# Patient Record
Sex: Male | Born: 1945
Health system: Southern US, Community
[De-identification: ages and names within clinical notes are randomized; demographics above are authoritative.]

## PROBLEM LIST (undated history)

## (undated) DIAGNOSIS — I2510 Atherosclerotic heart disease of native coronary artery without angina pectoris: Secondary | ICD-10-CM

## (undated) HISTORY — PX: CORONARY ARTERY BYPASS GRAFT: SHX141

## (undated) HISTORY — PX: TONSILLECTOMY: SUR1361

---

## 2014-01-26 ENCOUNTER — Encounter (HOSPITAL_COMMUNITY)
Admission: RE | Admit: 2014-01-26 | Discharge: 2014-01-26 | Disposition: A | Discharge status: Home / Self Care | Payer: Self-pay | Source: Ambulatory Visit | Attending: Cardiology | Admitting: Cardiology

## 2014-01-26 DIAGNOSIS — Z951 Presence of aortocoronary bypass graft: Secondary | ICD-10-CM | POA: Insufficient documentation

## 2014-01-26 DIAGNOSIS — I2510 Atherosclerotic heart disease of native coronary artery without angina pectoris: Secondary | ICD-10-CM | POA: Insufficient documentation

## 2014-01-28 ENCOUNTER — Encounter (HOSPITAL_COMMUNITY): Payer: Self-pay

## 2014-01-28 ENCOUNTER — Encounter (HOSPITAL_COMMUNITY)
Admission: RE | Admit: 2014-01-28 | Discharge: 2014-01-28 | Disposition: A | Discharge status: Home / Self Care | Payer: Self-pay | Source: Ambulatory Visit | Attending: Cardiology | Admitting: Cardiology

## 2014-01-30 ENCOUNTER — Encounter (HOSPITAL_COMMUNITY): Payer: Self-pay

## 2014-01-30 ENCOUNTER — Encounter (HOSPITAL_COMMUNITY)
Admission: RE | Admit: 2014-01-30 | Discharge: 2014-01-30 | Disposition: A | Discharge status: Home / Self Care | Payer: Self-pay | Source: Ambulatory Visit | Attending: Cardiology | Admitting: Cardiology

## 2014-02-02 ENCOUNTER — Encounter (HOSPITAL_COMMUNITY): Payer: Self-pay

## 2014-02-02 ENCOUNTER — Encounter (HOSPITAL_COMMUNITY)
Admission: RE | Admit: 2014-02-02 | Discharge: 2014-02-02 | Disposition: A | Discharge status: Home / Self Care | Payer: Self-pay | Source: Ambulatory Visit | Attending: Cardiology | Admitting: Cardiology

## 2014-02-04 ENCOUNTER — Encounter (HOSPITAL_COMMUNITY): Payer: Self-pay

## 2014-02-04 ENCOUNTER — Encounter (HOSPITAL_COMMUNITY)
Admission: RE | Admit: 2014-02-04 | Discharge: 2014-02-04 | Disposition: A | Discharge status: Home / Self Care | Payer: Self-pay | Source: Ambulatory Visit | Attending: Cardiology | Admitting: Cardiology

## 2014-02-06 ENCOUNTER — Encounter (HOSPITAL_COMMUNITY): Payer: Self-pay

## 2014-02-09 ENCOUNTER — Encounter (HOSPITAL_COMMUNITY): Payer: Self-pay

## 2014-02-11 ENCOUNTER — Encounter (HOSPITAL_COMMUNITY): Payer: Self-pay

## 2014-02-13 ENCOUNTER — Encounter (HOSPITAL_COMMUNITY): Payer: Self-pay

## 2014-02-16 ENCOUNTER — Encounter (HOSPITAL_COMMUNITY): Payer: Self-pay

## 2014-02-16 ENCOUNTER — Encounter (HOSPITAL_COMMUNITY)
Admission: RE | Admit: 2014-02-16 | Discharge: 2014-02-16 | Disposition: A | Discharge status: Home / Self Care | Payer: Self-pay | Source: Ambulatory Visit | Attending: Cardiology | Admitting: Cardiology

## 2014-02-16 DIAGNOSIS — Z951 Presence of aortocoronary bypass graft: Secondary | ICD-10-CM | POA: Insufficient documentation

## 2014-02-16 DIAGNOSIS — I2510 Atherosclerotic heart disease of native coronary artery without angina pectoris: Secondary | ICD-10-CM | POA: Insufficient documentation

## 2014-02-18 ENCOUNTER — Encounter (HOSPITAL_COMMUNITY): Payer: Self-pay

## 2014-02-18 ENCOUNTER — Encounter (HOSPITAL_COMMUNITY)
Admission: RE | Admit: 2014-02-18 | Discharge: 2014-02-18 | Disposition: A | Discharge status: Home / Self Care | Payer: Self-pay | Source: Ambulatory Visit | Attending: Cardiology | Admitting: Cardiology

## 2014-02-20 ENCOUNTER — Encounter (HOSPITAL_COMMUNITY)
Admission: RE | Admit: 2014-02-20 | Discharge: 2014-02-20 | Disposition: A | Discharge status: Home / Self Care | Payer: Self-pay | Source: Ambulatory Visit | Attending: Cardiology | Admitting: Cardiology

## 2014-02-20 ENCOUNTER — Encounter (HOSPITAL_COMMUNITY): Payer: Self-pay

## 2014-02-23 ENCOUNTER — Encounter (HOSPITAL_COMMUNITY)
Admission: RE | Admit: 2014-02-23 | Discharge: 2014-02-23 | Disposition: A | Discharge status: Home / Self Care | Payer: Self-pay | Source: Ambulatory Visit | Attending: Cardiology | Admitting: Cardiology

## 2014-02-23 ENCOUNTER — Encounter (HOSPITAL_COMMUNITY): Payer: Self-pay

## 2014-02-25 ENCOUNTER — Encounter (HOSPITAL_COMMUNITY): Payer: Self-pay

## 2014-02-25 ENCOUNTER — Encounter (HOSPITAL_COMMUNITY)
Admission: RE | Admit: 2014-02-25 | Discharge: 2014-02-25 | Disposition: A | Discharge status: Home / Self Care | Payer: Self-pay | Source: Ambulatory Visit | Attending: Cardiology | Admitting: Cardiology

## 2014-02-27 ENCOUNTER — Encounter (HOSPITAL_COMMUNITY): Payer: Self-pay

## 2014-02-27 ENCOUNTER — Encounter (HOSPITAL_COMMUNITY)
Admission: RE | Admit: 2014-02-27 | Discharge: 2014-02-27 | Disposition: A | Discharge status: Home / Self Care | Payer: Self-pay | Source: Ambulatory Visit | Attending: Cardiology | Admitting: Cardiology

## 2014-03-02 ENCOUNTER — Encounter (HOSPITAL_COMMUNITY): Payer: Self-pay

## 2014-03-02 ENCOUNTER — Encounter (HOSPITAL_COMMUNITY)
Admission: RE | Admit: 2014-03-02 | Discharge: 2014-03-02 | Disposition: A | Discharge status: Home / Self Care | Payer: Self-pay | Source: Ambulatory Visit | Attending: Cardiology | Admitting: Cardiology

## 2014-03-04 ENCOUNTER — Encounter (HOSPITAL_COMMUNITY)
Admission: RE | Admit: 2014-03-04 | Discharge: 2014-03-04 | Disposition: A | Discharge status: Home / Self Care | Payer: Self-pay | Source: Ambulatory Visit | Attending: Cardiology | Admitting: Cardiology

## 2014-03-04 ENCOUNTER — Encounter (HOSPITAL_COMMUNITY): Payer: Self-pay

## 2014-03-06 ENCOUNTER — Encounter (HOSPITAL_COMMUNITY): Payer: Self-pay

## 2014-03-09 ENCOUNTER — Encounter (HOSPITAL_COMMUNITY): Payer: Self-pay

## 2014-03-11 ENCOUNTER — Encounter (HOSPITAL_COMMUNITY)
Admission: RE | Admit: 2014-03-11 | Discharge: 2014-03-11 | Disposition: A | Discharge status: Home / Self Care | Payer: Self-pay | Source: Ambulatory Visit | Attending: Cardiology | Admitting: Cardiology

## 2014-03-11 ENCOUNTER — Encounter (HOSPITAL_COMMUNITY): Payer: Self-pay

## 2014-03-13 ENCOUNTER — Encounter (HOSPITAL_COMMUNITY)
Admission: RE | Admit: 2014-03-13 | Discharge: 2014-03-13 | Disposition: A | Discharge status: Home / Self Care | Payer: Self-pay | Source: Ambulatory Visit | Attending: Cardiology | Admitting: Cardiology

## 2014-03-13 ENCOUNTER — Encounter (HOSPITAL_COMMUNITY): Payer: Self-pay

## 2014-03-16 ENCOUNTER — Encounter (HOSPITAL_COMMUNITY): Payer: Self-pay

## 2014-03-16 ENCOUNTER — Encounter (HOSPITAL_COMMUNITY)
Admission: RE | Admit: 2014-03-16 | Discharge: 2014-03-16 | Disposition: A | Discharge status: Home / Self Care | Payer: Self-pay | Source: Ambulatory Visit | Attending: Cardiology | Admitting: Cardiology

## 2014-03-16 DIAGNOSIS — I2510 Atherosclerotic heart disease of native coronary artery without angina pectoris: Secondary | ICD-10-CM | POA: Insufficient documentation

## 2014-03-16 DIAGNOSIS — Z951 Presence of aortocoronary bypass graft: Secondary | ICD-10-CM | POA: Insufficient documentation

## 2014-03-18 ENCOUNTER — Encounter (HOSPITAL_COMMUNITY): Payer: Self-pay

## 2014-03-18 ENCOUNTER — Encounter (HOSPITAL_COMMUNITY)
Admission: RE | Admit: 2014-03-18 | Discharge: 2014-03-18 | Disposition: A | Discharge status: Home / Self Care | Payer: Self-pay | Source: Ambulatory Visit | Attending: Cardiology | Admitting: Cardiology

## 2014-03-20 ENCOUNTER — Encounter (HOSPITAL_COMMUNITY)
Admission: RE | Admit: 2014-03-20 | Discharge: 2014-03-20 | Disposition: A | Discharge status: Home / Self Care | Payer: Self-pay | Source: Ambulatory Visit | Attending: Cardiology | Admitting: Cardiology

## 2014-03-20 ENCOUNTER — Encounter (HOSPITAL_COMMUNITY): Payer: Self-pay

## 2014-03-23 ENCOUNTER — Encounter (HOSPITAL_COMMUNITY)
Admission: RE | Admit: 2014-03-23 | Discharge: 2014-03-23 | Disposition: A | Discharge status: Home / Self Care | Payer: Self-pay | Source: Ambulatory Visit | Attending: Cardiology | Admitting: Cardiology

## 2014-03-23 ENCOUNTER — Encounter (HOSPITAL_COMMUNITY): Payer: Self-pay

## 2014-03-25 ENCOUNTER — Encounter (HOSPITAL_COMMUNITY): Payer: Self-pay

## 2014-03-25 ENCOUNTER — Encounter (HOSPITAL_COMMUNITY)
Admission: RE | Admit: 2014-03-25 | Discharge: 2014-03-25 | Disposition: A | Discharge status: Home / Self Care | Payer: Self-pay | Source: Ambulatory Visit | Attending: Cardiology | Admitting: Cardiology

## 2014-03-27 ENCOUNTER — Encounter (HOSPITAL_COMMUNITY)
Admission: RE | Admit: 2014-03-27 | Discharge: 2014-03-27 | Disposition: A | Discharge status: Home / Self Care | Payer: Self-pay | Source: Ambulatory Visit | Attending: Cardiology | Admitting: Cardiology

## 2014-03-27 ENCOUNTER — Encounter (HOSPITAL_COMMUNITY): Payer: Self-pay

## 2014-03-30 ENCOUNTER — Encounter (HOSPITAL_COMMUNITY): Payer: Self-pay

## 2014-04-01 ENCOUNTER — Encounter (HOSPITAL_COMMUNITY): Payer: Self-pay

## 2014-04-01 ENCOUNTER — Encounter (HOSPITAL_COMMUNITY)
Admission: RE | Admit: 2014-04-01 | Discharge: 2014-04-01 | Disposition: A | Discharge status: Home / Self Care | Payer: Self-pay | Source: Ambulatory Visit | Attending: Cardiology | Admitting: Cardiology

## 2014-04-03 ENCOUNTER — Encounter (HOSPITAL_COMMUNITY)
Admission: RE | Admit: 2014-04-03 | Discharge: 2014-04-03 | Disposition: A | Discharge status: Home / Self Care | Payer: Self-pay | Source: Ambulatory Visit | Attending: Cardiology | Admitting: Cardiology

## 2014-04-03 ENCOUNTER — Encounter (HOSPITAL_COMMUNITY): Payer: Self-pay

## 2014-04-06 ENCOUNTER — Encounter (HOSPITAL_COMMUNITY): Payer: Self-pay

## 2014-04-06 ENCOUNTER — Encounter (HOSPITAL_COMMUNITY)
Admission: RE | Admit: 2014-04-06 | Discharge: 2014-04-06 | Disposition: A | Discharge status: Home / Self Care | Payer: Self-pay | Source: Ambulatory Visit | Attending: Cardiology | Admitting: Cardiology

## 2014-04-08 ENCOUNTER — Encounter (HOSPITAL_COMMUNITY)
Admission: RE | Admit: 2014-04-08 | Discharge: 2014-04-08 | Disposition: A | Discharge status: Home / Self Care | Payer: Self-pay | Source: Ambulatory Visit | Attending: Cardiology | Admitting: Cardiology

## 2014-04-08 ENCOUNTER — Encounter (HOSPITAL_COMMUNITY): Payer: Self-pay

## 2014-04-10 ENCOUNTER — Encounter (HOSPITAL_COMMUNITY): Payer: Self-pay

## 2014-04-10 ENCOUNTER — Encounter (HOSPITAL_COMMUNITY)
Admission: RE | Admit: 2014-04-10 | Discharge: 2014-04-10 | Disposition: A | Discharge status: Home / Self Care | Payer: Self-pay | Source: Ambulatory Visit | Attending: Cardiology | Admitting: Cardiology

## 2014-04-13 ENCOUNTER — Encounter (HOSPITAL_COMMUNITY): Payer: Self-pay

## 2014-04-13 ENCOUNTER — Encounter (HOSPITAL_COMMUNITY)
Admission: RE | Admit: 2014-04-13 | Discharge: 2014-04-13 | Disposition: A | Discharge status: Home / Self Care | Payer: Self-pay | Source: Ambulatory Visit | Attending: Cardiology | Admitting: Cardiology

## 2014-04-15 ENCOUNTER — Encounter (HOSPITAL_COMMUNITY): Payer: Self-pay

## 2014-04-17 ENCOUNTER — Encounter (HOSPITAL_COMMUNITY)
Admission: RE | Admit: 2014-04-17 | Discharge: 2014-04-17 | Disposition: A | Discharge status: Home / Self Care | Payer: Self-pay | Source: Ambulatory Visit | Attending: Cardiology | Admitting: Cardiology

## 2014-04-17 ENCOUNTER — Encounter (HOSPITAL_COMMUNITY): Payer: Self-pay

## 2014-04-17 DIAGNOSIS — Z951 Presence of aortocoronary bypass graft: Secondary | ICD-10-CM | POA: Insufficient documentation

## 2014-04-17 DIAGNOSIS — I2510 Atherosclerotic heart disease of native coronary artery without angina pectoris: Secondary | ICD-10-CM | POA: Insufficient documentation

## 2014-04-20 ENCOUNTER — Encounter (HOSPITAL_COMMUNITY)
Admission: RE | Admit: 2014-04-20 | Discharge: 2014-04-20 | Disposition: A | Discharge status: Home / Self Care | Payer: Self-pay | Source: Ambulatory Visit | Attending: Cardiology | Admitting: Cardiology

## 2014-04-22 ENCOUNTER — Encounter (HOSPITAL_COMMUNITY)
Admission: RE | Admit: 2014-04-22 | Discharge: 2014-04-22 | Disposition: A | Discharge status: Home / Self Care | Payer: Self-pay | Source: Ambulatory Visit | Attending: Cardiology | Admitting: Cardiology

## 2014-04-24 ENCOUNTER — Encounter (HOSPITAL_COMMUNITY)
Admission: RE | Admit: 2014-04-24 | Discharge: 2014-04-24 | Disposition: A | Discharge status: Home / Self Care | Payer: Self-pay | Source: Ambulatory Visit | Attending: Cardiology | Admitting: Cardiology

## 2014-04-27 ENCOUNTER — Encounter (HOSPITAL_COMMUNITY)
Admission: RE | Admit: 2014-04-27 | Discharge: 2014-04-27 | Disposition: A | Discharge status: Home / Self Care | Payer: Self-pay | Source: Ambulatory Visit | Attending: Cardiology | Admitting: Cardiology

## 2014-04-29 ENCOUNTER — Encounter (HOSPITAL_COMMUNITY)
Admission: RE | Admit: 2014-04-29 | Discharge: 2014-04-29 | Disposition: A | Discharge status: Home / Self Care | Payer: Self-pay | Source: Ambulatory Visit | Attending: Cardiology | Admitting: Cardiology

## 2014-05-01 ENCOUNTER — Encounter (HOSPITAL_COMMUNITY)
Admission: RE | Admit: 2014-05-01 | Discharge: 2014-05-01 | Disposition: A | Discharge status: Home / Self Care | Payer: Self-pay | Source: Ambulatory Visit | Attending: Cardiology | Admitting: Cardiology

## 2014-05-04 ENCOUNTER — Encounter (HOSPITAL_COMMUNITY)
Admission: RE | Admit: 2014-05-04 | Discharge: 2014-05-04 | Disposition: A | Discharge status: Home / Self Care | Payer: Self-pay | Source: Ambulatory Visit | Attending: Cardiology | Admitting: Cardiology

## 2014-05-06 ENCOUNTER — Encounter (HOSPITAL_COMMUNITY): Admission: RE | Admit: 2014-05-06 | Payer: Self-pay | Source: Ambulatory Visit

## 2014-05-08 ENCOUNTER — Encounter (HOSPITAL_COMMUNITY): Payer: Self-pay

## 2014-05-11 ENCOUNTER — Encounter (HOSPITAL_COMMUNITY): Payer: Self-pay

## 2014-05-12 DIAGNOSIS — E785 Hyperlipidemia, unspecified: Secondary | ICD-10-CM | POA: Diagnosis not present

## 2014-05-12 DIAGNOSIS — I2510 Atherosclerotic heart disease of native coronary artery without angina pectoris: Secondary | ICD-10-CM | POA: Diagnosis not present

## 2014-05-13 ENCOUNTER — Encounter (HOSPITAL_COMMUNITY): Payer: Self-pay

## 2014-05-15 ENCOUNTER — Encounter (HOSPITAL_COMMUNITY): Payer: Self-pay

## 2014-05-18 ENCOUNTER — Encounter (HOSPITAL_COMMUNITY): Payer: Self-pay

## 2014-05-20 ENCOUNTER — Encounter (HOSPITAL_COMMUNITY): Payer: Self-pay

## 2014-05-22 ENCOUNTER — Encounter (HOSPITAL_COMMUNITY): Payer: Self-pay

## 2014-05-25 ENCOUNTER — Encounter (HOSPITAL_COMMUNITY): Payer: Self-pay

## 2014-06-29 DIAGNOSIS — Z1211 Encounter for screening for malignant neoplasm of colon: Secondary | ICD-10-CM | POA: Diagnosis not present

## 2014-07-03 DIAGNOSIS — D125 Benign neoplasm of sigmoid colon: Secondary | ICD-10-CM | POA: Diagnosis not present

## 2014-07-03 DIAGNOSIS — Z8249 Family history of ischemic heart disease and other diseases of the circulatory system: Secondary | ICD-10-CM | POA: Diagnosis not present

## 2014-07-03 DIAGNOSIS — D124 Benign neoplasm of descending colon: Secondary | ICD-10-CM | POA: Diagnosis not present

## 2014-07-03 DIAGNOSIS — K635 Polyp of colon: Secondary | ICD-10-CM | POA: Diagnosis not present

## 2014-07-03 DIAGNOSIS — D120 Benign neoplasm of cecum: Secondary | ICD-10-CM | POA: Diagnosis not present

## 2014-07-03 DIAGNOSIS — Z951 Presence of aortocoronary bypass graft: Secondary | ICD-10-CM | POA: Diagnosis not present

## 2014-07-03 DIAGNOSIS — Z1211 Encounter for screening for malignant neoplasm of colon: Secondary | ICD-10-CM | POA: Diagnosis not present

## 2014-07-03 DIAGNOSIS — E785 Hyperlipidemia, unspecified: Secondary | ICD-10-CM | POA: Diagnosis not present

## 2014-07-03 DIAGNOSIS — Z79899 Other long term (current) drug therapy: Secondary | ICD-10-CM | POA: Diagnosis not present

## 2014-07-03 DIAGNOSIS — D122 Benign neoplasm of ascending colon: Secondary | ICD-10-CM | POA: Diagnosis not present

## 2014-07-03 DIAGNOSIS — Z801 Family history of malignant neoplasm of trachea, bronchus and lung: Secondary | ICD-10-CM | POA: Diagnosis not present

## 2014-07-03 DIAGNOSIS — K5730 Diverticulosis of large intestine without perforation or abscess without bleeding: Secondary | ICD-10-CM | POA: Diagnosis not present

## 2014-07-03 DIAGNOSIS — I10 Essential (primary) hypertension: Secondary | ICD-10-CM | POA: Diagnosis not present

## 2014-07-03 DIAGNOSIS — I2510 Atherosclerotic heart disease of native coronary artery without angina pectoris: Secondary | ICD-10-CM | POA: Diagnosis not present

## 2014-09-03 DIAGNOSIS — Z0001 Encounter for general adult medical examination with abnormal findings: Secondary | ICD-10-CM | POA: Diagnosis not present

## 2014-09-03 DIAGNOSIS — E780 Pure hypercholesterolemia: Secondary | ICD-10-CM | POA: Diagnosis not present

## 2014-09-03 DIAGNOSIS — R7301 Impaired fasting glucose: Secondary | ICD-10-CM | POA: Diagnosis not present

## 2014-09-03 DIAGNOSIS — I2510 Atherosclerotic heart disease of native coronary artery without angina pectoris: Secondary | ICD-10-CM | POA: Diagnosis not present

## 2014-09-03 DIAGNOSIS — Z1389 Encounter for screening for other disorder: Secondary | ICD-10-CM | POA: Diagnosis not present

## 2014-09-07 DIAGNOSIS — E782 Mixed hyperlipidemia: Secondary | ICD-10-CM | POA: Diagnosis not present

## 2014-10-08 DIAGNOSIS — E782 Mixed hyperlipidemia: Secondary | ICD-10-CM | POA: Diagnosis not present

## 2014-11-08 DIAGNOSIS — I2510 Atherosclerotic heart disease of native coronary artery without angina pectoris: Secondary | ICD-10-CM | POA: Diagnosis not present

## 2014-11-17 DIAGNOSIS — E785 Hyperlipidemia, unspecified: Secondary | ICD-10-CM | POA: Diagnosis not present

## 2014-11-17 DIAGNOSIS — I2510 Atherosclerotic heart disease of native coronary artery without angina pectoris: Secondary | ICD-10-CM | POA: Diagnosis not present

## 2014-12-08 DIAGNOSIS — I2510 Atherosclerotic heart disease of native coronary artery without angina pectoris: Secondary | ICD-10-CM | POA: Diagnosis not present

## 2015-01-08 DIAGNOSIS — I2510 Atherosclerotic heart disease of native coronary artery without angina pectoris: Secondary | ICD-10-CM | POA: Diagnosis not present

## 2015-02-07 DIAGNOSIS — I2510 Atherosclerotic heart disease of native coronary artery without angina pectoris: Secondary | ICD-10-CM | POA: Diagnosis not present

## 2015-02-18 DIAGNOSIS — E782 Mixed hyperlipidemia: Secondary | ICD-10-CM | POA: Diagnosis not present

## 2015-02-18 DIAGNOSIS — R7301 Impaired fasting glucose: Secondary | ICD-10-CM | POA: Diagnosis not present

## 2015-02-18 DIAGNOSIS — I2510 Atherosclerotic heart disease of native coronary artery without angina pectoris: Secondary | ICD-10-CM | POA: Diagnosis not present

## 2015-02-25 DIAGNOSIS — E782 Mixed hyperlipidemia: Secondary | ICD-10-CM | POA: Diagnosis not present

## 2015-02-25 DIAGNOSIS — Z23 Encounter for immunization: Secondary | ICD-10-CM | POA: Diagnosis not present

## 2015-02-25 DIAGNOSIS — I2510 Atherosclerotic heart disease of native coronary artery without angina pectoris: Secondary | ICD-10-CM | POA: Diagnosis not present

## 2015-02-25 DIAGNOSIS — R7301 Impaired fasting glucose: Secondary | ICD-10-CM | POA: Diagnosis not present

## 2015-05-18 DIAGNOSIS — I2510 Atherosclerotic heart disease of native coronary artery without angina pectoris: Secondary | ICD-10-CM | POA: Diagnosis not present

## 2015-05-24 DIAGNOSIS — I2510 Atherosclerotic heart disease of native coronary artery without angina pectoris: Secondary | ICD-10-CM | POA: Diagnosis not present

## 2015-09-02 DIAGNOSIS — E782 Mixed hyperlipidemia: Secondary | ICD-10-CM | POA: Diagnosis not present

## 2015-09-02 DIAGNOSIS — R7301 Impaired fasting glucose: Secondary | ICD-10-CM | POA: Diagnosis not present

## 2015-09-06 DIAGNOSIS — Z6828 Body mass index (BMI) 28.0-28.9, adult: Secondary | ICD-10-CM | POA: Diagnosis not present

## 2015-09-06 DIAGNOSIS — Z1389 Encounter for screening for other disorder: Secondary | ICD-10-CM | POA: Diagnosis not present

## 2015-09-06 DIAGNOSIS — Z0001 Encounter for general adult medical examination with abnormal findings: Secondary | ICD-10-CM | POA: Diagnosis not present

## 2015-10-02 DIAGNOSIS — H9201 Otalgia, right ear: Secondary | ICD-10-CM | POA: Diagnosis not present

## 2015-10-08 DIAGNOSIS — H6123 Impacted cerumen, bilateral: Secondary | ICD-10-CM | POA: Diagnosis not present

## 2016-03-07 DIAGNOSIS — R739 Hyperglycemia, unspecified: Secondary | ICD-10-CM | POA: Diagnosis not present

## 2016-03-07 DIAGNOSIS — E782 Mixed hyperlipidemia: Secondary | ICD-10-CM | POA: Diagnosis not present

## 2016-03-14 DIAGNOSIS — Z23 Encounter for immunization: Secondary | ICD-10-CM | POA: Diagnosis not present

## 2016-03-14 DIAGNOSIS — I2510 Atherosclerotic heart disease of native coronary artery without angina pectoris: Secondary | ICD-10-CM | POA: Diagnosis not present

## 2016-03-14 DIAGNOSIS — E782 Mixed hyperlipidemia: Secondary | ICD-10-CM | POA: Diagnosis not present

## 2016-03-14 DIAGNOSIS — R7301 Impaired fasting glucose: Secondary | ICD-10-CM | POA: Diagnosis not present

## 2016-03-14 DIAGNOSIS — M545 Low back pain: Secondary | ICD-10-CM | POA: Diagnosis not present

## 2016-03-22 DIAGNOSIS — J209 Acute bronchitis, unspecified: Secondary | ICD-10-CM | POA: Diagnosis not present

## 2016-03-22 DIAGNOSIS — R05 Cough: Secondary | ICD-10-CM | POA: Diagnosis not present

## 2016-05-23 DIAGNOSIS — E785 Hyperlipidemia, unspecified: Secondary | ICD-10-CM | POA: Diagnosis not present

## 2016-05-23 DIAGNOSIS — I2510 Atherosclerotic heart disease of native coronary artery without angina pectoris: Secondary | ICD-10-CM | POA: Diagnosis not present

## 2016-07-28 DIAGNOSIS — H578 Other specified disorders of eye and adnexa: Secondary | ICD-10-CM | POA: Diagnosis not present

## 2016-07-28 DIAGNOSIS — T1501XA Foreign body in cornea, right eye, initial encounter: Secondary | ICD-10-CM | POA: Diagnosis not present

## 2016-09-05 DIAGNOSIS — E782 Mixed hyperlipidemia: Secondary | ICD-10-CM | POA: Diagnosis not present

## 2016-09-05 DIAGNOSIS — R739 Hyperglycemia, unspecified: Secondary | ICD-10-CM | POA: Diagnosis not present

## 2016-09-05 DIAGNOSIS — I2510 Atherosclerotic heart disease of native coronary artery without angina pectoris: Secondary | ICD-10-CM | POA: Diagnosis not present

## 2016-09-12 DIAGNOSIS — M545 Low back pain: Secondary | ICD-10-CM | POA: Diagnosis not present

## 2016-09-12 DIAGNOSIS — R7301 Impaired fasting glucose: Secondary | ICD-10-CM | POA: Diagnosis not present

## 2016-09-12 DIAGNOSIS — Z1389 Encounter for screening for other disorder: Secondary | ICD-10-CM | POA: Diagnosis not present

## 2016-09-12 DIAGNOSIS — E782 Mixed hyperlipidemia: Secondary | ICD-10-CM | POA: Diagnosis not present

## 2016-09-12 DIAGNOSIS — I2510 Atherosclerotic heart disease of native coronary artery without angina pectoris: Secondary | ICD-10-CM | POA: Diagnosis not present

## 2016-10-06 DIAGNOSIS — H5213 Myopia, bilateral: Secondary | ICD-10-CM | POA: Diagnosis not present

## 2016-10-06 DIAGNOSIS — H52223 Regular astigmatism, bilateral: Secondary | ICD-10-CM | POA: Diagnosis not present

## 2016-12-07 DIAGNOSIS — R7301 Impaired fasting glucose: Secondary | ICD-10-CM | POA: Diagnosis not present

## 2016-12-07 DIAGNOSIS — E782 Mixed hyperlipidemia: Secondary | ICD-10-CM | POA: Diagnosis not present

## 2016-12-07 DIAGNOSIS — R739 Hyperglycemia, unspecified: Secondary | ICD-10-CM | POA: Diagnosis not present

## 2016-12-18 DIAGNOSIS — Z0001 Encounter for general adult medical examination with abnormal findings: Secondary | ICD-10-CM | POA: Diagnosis not present

## 2016-12-18 DIAGNOSIS — Z23 Encounter for immunization: Secondary | ICD-10-CM | POA: Diagnosis not present

## 2016-12-18 DIAGNOSIS — R7301 Impaired fasting glucose: Secondary | ICD-10-CM | POA: Diagnosis not present

## 2016-12-27 DIAGNOSIS — D485 Neoplasm of uncertain behavior of skin: Secondary | ICD-10-CM | POA: Diagnosis not present

## 2016-12-27 DIAGNOSIS — C44619 Basal cell carcinoma of skin of left upper limb, including shoulder: Secondary | ICD-10-CM | POA: Diagnosis not present

## 2017-05-29 DIAGNOSIS — I2510 Atherosclerotic heart disease of native coronary artery without angina pectoris: Secondary | ICD-10-CM | POA: Diagnosis not present

## 2017-06-15 DIAGNOSIS — R739 Hyperglycemia, unspecified: Secondary | ICD-10-CM | POA: Diagnosis not present

## 2017-06-15 DIAGNOSIS — E559 Vitamin D deficiency, unspecified: Secondary | ICD-10-CM | POA: Diagnosis not present

## 2017-06-15 DIAGNOSIS — Z0000 Encounter for general adult medical examination without abnormal findings: Secondary | ICD-10-CM | POA: Diagnosis not present

## 2017-06-15 DIAGNOSIS — E782 Mixed hyperlipidemia: Secondary | ICD-10-CM | POA: Diagnosis not present

## 2017-06-15 DIAGNOSIS — R7301 Impaired fasting glucose: Secondary | ICD-10-CM | POA: Diagnosis not present

## 2017-06-20 DIAGNOSIS — R7301 Impaired fasting glucose: Secondary | ICD-10-CM | POA: Diagnosis not present

## 2017-06-20 DIAGNOSIS — I2510 Atherosclerotic heart disease of native coronary artery without angina pectoris: Secondary | ICD-10-CM | POA: Diagnosis not present

## 2017-06-20 DIAGNOSIS — M545 Low back pain: Secondary | ICD-10-CM | POA: Diagnosis not present

## 2017-06-20 DIAGNOSIS — E782 Mixed hyperlipidemia: Secondary | ICD-10-CM | POA: Diagnosis not present

## 2017-07-05 ENCOUNTER — Encounter (INDEPENDENT_AMBULATORY_CARE_PROVIDER_SITE_OTHER): Payer: Self-pay | Admitting: *Deleted

## 2017-07-18 DIAGNOSIS — J209 Acute bronchitis, unspecified: Secondary | ICD-10-CM | POA: Diagnosis not present

## 2017-07-26 DIAGNOSIS — H9190 Unspecified hearing loss, unspecified ear: Secondary | ICD-10-CM | POA: Diagnosis not present

## 2017-07-26 DIAGNOSIS — H6123 Impacted cerumen, bilateral: Secondary | ICD-10-CM | POA: Diagnosis not present

## 2017-07-30 DIAGNOSIS — H6093 Unspecified otitis externa, bilateral: Secondary | ICD-10-CM | POA: Diagnosis not present

## 2017-07-30 DIAGNOSIS — H6692 Otitis media, unspecified, left ear: Secondary | ICD-10-CM | POA: Diagnosis not present

## 2017-08-02 ENCOUNTER — Encounter (INDEPENDENT_AMBULATORY_CARE_PROVIDER_SITE_OTHER): Payer: Self-pay | Admitting: *Deleted

## 2017-08-03 ENCOUNTER — Other Ambulatory Visit (INDEPENDENT_AMBULATORY_CARE_PROVIDER_SITE_OTHER): Payer: Self-pay | Admitting: *Deleted

## 2017-08-03 DIAGNOSIS — Z86010 Personal history of colonic polyps: Secondary | ICD-10-CM | POA: Insufficient documentation

## 2017-08-14 ENCOUNTER — Encounter (INDEPENDENT_AMBULATORY_CARE_PROVIDER_SITE_OTHER): Payer: Self-pay | Admitting: *Deleted

## 2017-08-14 ENCOUNTER — Telehealth (INDEPENDENT_AMBULATORY_CARE_PROVIDER_SITE_OTHER): Payer: Self-pay | Admitting: *Deleted

## 2017-08-14 MED ORDER — PEG 3350-KCL-NA BICARB-NACL 420 G PO SOLR: 4000.0000 mL | Freq: Once | ORAL | 0 refills | Status: AC

## 2017-08-14 NOTE — Telephone Encounter (Signed)
Patient needs trilyte 

## 2017-09-11 ENCOUNTER — Telehealth (INDEPENDENT_AMBULATORY_CARE_PROVIDER_SITE_OTHER): Payer: Self-pay | Admitting: *Deleted

## 2017-09-11 NOTE — Telephone Encounter (Signed)
agree

## 2017-09-11 NOTE — Telephone Encounter (Signed)
Referring MD/PCP: sasser   Procedure: tcs  Reason/Indication:  Hx polyps  Has patient had this procedure before?  Yes, 2016 -- scanned  If so, when, by whom and where?    Is there a family history of colon cancer?  no  Who?  What age when diagnosed?    Is patient diabetic?   no      Does patient have prosthetic heart valve or mechanical valve?  no  Do you have a pacemaker?  no  Has patient ever had endocarditis? no  Has patient had joint replacement within last 12 months?  no  Is patient constipated or do they take laxatives? no  Does patient have a history of alcohol/drug use?  no  Is patient on blood thinner such as Coumadin, Plavix and/or Aspirin? yes  Medications: asa 81 mg daily, atorvastatin 20 mg daily, metoprolol 12.5 mg daily, vitamins  Allergies: pcn  Medication Adjustment per Dr Lindi Adie, NP: asa 2 days  Procedure date & time: 10/10/17 at 930

## 2017-10-08 ENCOUNTER — Other Ambulatory Visit (INDEPENDENT_AMBULATORY_CARE_PROVIDER_SITE_OTHER): Payer: Self-pay | Admitting: *Deleted

## 2017-10-08 DIAGNOSIS — Z86010 Personal history of colonic polyps: Secondary | ICD-10-CM

## 2017-10-10 ENCOUNTER — Other Ambulatory Visit: Payer: Self-pay

## 2017-10-10 ENCOUNTER — Encounter (HOSPITAL_COMMUNITY): Payer: Self-pay | Admitting: *Deleted

## 2017-10-10 ENCOUNTER — Encounter (HOSPITAL_COMMUNITY)
Admission: RE | Disposition: A | Discharge status: Home / Self Care | Payer: Self-pay | Source: Ambulatory Visit | Attending: Internal Medicine

## 2017-10-10 ENCOUNTER — Ambulatory Visit (HOSPITAL_COMMUNITY)
Admission: RE | Admit: 2017-10-10 | Discharge: 2017-10-10 | Disposition: A | Discharge status: Home / Self Care | Payer: Medicare Other | Source: Ambulatory Visit | Attending: Internal Medicine | Admitting: Internal Medicine

## 2017-10-10 DIAGNOSIS — D122 Benign neoplasm of ascending colon: Secondary | ICD-10-CM | POA: Insufficient documentation

## 2017-10-10 DIAGNOSIS — Z951 Presence of aortocoronary bypass graft: Secondary | ICD-10-CM | POA: Diagnosis not present

## 2017-10-10 DIAGNOSIS — Z1211 Encounter for screening for malignant neoplasm of colon: Secondary | ICD-10-CM | POA: Diagnosis not present

## 2017-10-10 DIAGNOSIS — Z79899 Other long term (current) drug therapy: Secondary | ICD-10-CM | POA: Insufficient documentation

## 2017-10-10 DIAGNOSIS — Z86010 Personal history of colonic polyps: Secondary | ICD-10-CM | POA: Insufficient documentation

## 2017-10-10 DIAGNOSIS — Z880 Allergy status to penicillin: Secondary | ICD-10-CM | POA: Insufficient documentation

## 2017-10-10 DIAGNOSIS — I2510 Atherosclerotic heart disease of native coronary artery without angina pectoris: Secondary | ICD-10-CM | POA: Diagnosis not present

## 2017-10-10 DIAGNOSIS — Z09 Encounter for follow-up examination after completed treatment for conditions other than malignant neoplasm: Secondary | ICD-10-CM | POA: Diagnosis not present

## 2017-10-10 DIAGNOSIS — K644 Residual hemorrhoidal skin tags: Secondary | ICD-10-CM | POA: Insufficient documentation

## 2017-10-10 DIAGNOSIS — Z7982 Long term (current) use of aspirin: Secondary | ICD-10-CM | POA: Insufficient documentation

## 2017-10-10 DIAGNOSIS — K5730 Diverticulosis of large intestine without perforation or abscess without bleeding: Secondary | ICD-10-CM | POA: Diagnosis not present

## 2017-10-10 HISTORY — DX: Atherosclerotic heart disease of native coronary artery without angina pectoris: I25.10

## 2017-10-10 HISTORY — PX: COLONOSCOPY: SHX5424

## 2017-10-10 HISTORY — PX: POLYPECTOMY: SHX5525

## 2017-10-10 SURGERY — COLONOSCOPY
Anesthesia: Moderate Sedation

## 2017-10-10 MED ORDER — SODIUM CHLORIDE 0.9 % IV SOLN: INTRAVENOUS | Status: DC

## 2017-10-10 MED ORDER — STERILE WATER FOR IRRIGATION IR SOLN
Status: DC | PRN
  Administered 2017-10-10: 1.5 mL

## 2017-10-10 MED ORDER — MEPERIDINE HCL 50 MG/ML IJ SOLN
INTRAMUSCULAR | Status: DC | PRN
  Administered 2017-10-10 (×2): 25 mg via INTRAVENOUS

## 2017-10-10 MED ORDER — MEPERIDINE HCL 50 MG/ML IJ SOLN
INTRAMUSCULAR | Status: AC
  Filled 2017-10-10: qty 1

## 2017-10-10 MED ORDER — MIDAZOLAM HCL 5 MG/5ML IJ SOLN
INTRAMUSCULAR | Status: DC | PRN
  Administered 2017-10-10 (×2): 2 mg via INTRAVENOUS

## 2017-10-10 MED ORDER — MIDAZOLAM HCL 5 MG/5ML IJ SOLN
INTRAMUSCULAR | Status: AC
  Filled 2017-10-10: qty 10

## 2017-10-10 MED ORDER — SODIUM CHLORIDE 0.9 % IV SOLN
INTRAVENOUS | Status: DC
  Administered 2017-10-10: 1000 mL via INTRAVENOUS

## 2017-10-10 NOTE — H&P (Signed)
Todd Carlson is an 72 y.o. male.   Chief Complaint: Patient is here for colonoscopy. HPI: Patient is 72 year old Caucasian male who is here for surveillance colonoscopy.  His last exam was in May 2016 by Dr. Sunday [facility] of [facility].  He had a tubular adenomas removed.  He denies abdominal pain change in bowel habits or rectal bleeding. While family history is negative for colorectal carcinoma family history significant for other malignancies.  His father had lung carcinoma.  His sister died of cervical cancer in her 38s.  His father had 5 siblings and 4 of them died of various malignancies. He has been off aspirin for 2 days.  Past Medical History:  Diagnosis Date  . Coronary artery disease     Past Surgical History:  Procedure Laterality Date  . CORONARY ARTERY BYPASS GRAFT    . TONSILLECTOMY      History reviewed. No pertinent family history. Social History:  reports that he has never smoked. He has never used smokeless tobacco. His alcohol and drug histories are not on file.  Allergies:  Allergies  Allergen Reactions  . Penicillins Diarrhea, Nausea Only and Other (See Comments)    Stomach pain Has patient had a PCN reaction causing immediate rash, facial/tongue/throat swelling, SOB or lightheadedness with hypotension: No Has patient had a PCN reaction causing severe rash involving mucus membranes or skin necrosis: No Has patient had a PCN reaction that required hospitalization: No Has patient had a PCN reaction occurring within the last 10 years: No If all of the above answers are "NO", then may proceed with Cephalosporin use.     Medications Prior to Admission  Medication Sig Dispense Refill  . Acetylcarnitine HCl (ACETYL L-CARNITINE) 500 MG CAPS Take 1,000 mg by mouth 2 (two) times daily.    . Ascorbyl Palmitate POWD Take 1,000 mg by mouth 2 (two) times daily.    Marland Kitchen aspirin EC 81 MG tablet Take 81 mg by mouth daily.    Marland Kitchen atorvastatin (LIPITOR) 20 MG tablet  Take 20 mg by mouth at bedtime.    Jolyne Loa Grape-Goldenseal (BERBERINE COMPLEX PO) Take 800-1,200 mg by mouth See admin instructions. Take 1200 mg by mouth in the morning and take 800 mg by mouth at bedtime    . Cholecalciferol (VITAMIN D3) 10000 units TABS Take 10,000 Units by mouth 2 (two) times daily.    . Coenzyme Q10 (CO Q-10) 300 MG CAPS Take 300 mg by mouth 2 (two) times daily.    . Garlic 3903 MG CAPS Take 1,200 mg by mouth 2 (two) times daily.    . Magnesium (CVS TRIPLE MAGNESIUM COMPLEX) 400 MG CAPS Take 2,000 mg by mouth daily.    Marland Kitchen MALIC ACID PO Take 009 mg by mouth 2 (two) times daily.    . metoprolol tartrate (LOPRESSOR) 25 MG tablet Take 12.5 mg by mouth 2 (two) times daily.    . Misc Natural Products (BETA-SITOSTEROL PLANT STEROLS PO) Take 400 mg by mouth 2 (two) times daily.    . Multiple Vitamins-Minerals (MULTIVITAMIN PO) Take 1 tablet by mouth daily.    . Omega-3 Fatty Acids (FISH OIL PO) Take 5,400 mg by mouth 2 (two) times daily.    Marland Kitchen PANTETHINE PO Take 600 mg by mouth 2 (two) times daily.    Marland Kitchen PHOSPHATIDYL CHOLINE PO Take 1,260 mg by mouth 2 (two) times daily.    . Probiotic Product (PROBIOTIC PO) Take 1 capsule by mouth at bedtime.    Marland Kitchen  Taurine 500 MG CAPS Take 500 mg by mouth 2 (two) times daily.    Marland Kitchen thiamine (VITAMIN B-1) 100 MG tablet Take 100 mg by mouth daily.    . Turmeric 500 MG CAPS Take 1,000 mg by mouth 2 (two) times daily.    Marland Kitchen VITAMIN K PO Take 2,000 mcg by mouth daily.      No results found for this or any previous visit (from the past 48 hour(s)). No results found.  ROS  Blood pressure (!) 141/60, pulse 60, temperature 99.1 F (37.3 C), temperature source Oral, resp. rate 15, height 5\' 7"  (1.702 m), weight 81.6 kg, SpO2 98 %. Physical Exam  Constitutional: He appears well-developed and well-nourished.  HENT:  Mouth/Throat: Oropharynx is clear and moist.  Eyes: Conjunctivae are normal. No scleral icterus.  Neck: No thyromegaly present.   Cardiovascular: Normal rate and regular rhythm.  No murmur heard. Respiratory: Effort normal and breath sounds normal.  Midsternal scar.  GI: Soft. He exhibits no distension and no mass. There is no tenderness.  Musculoskeletal: He exhibits no edema.  Lymphadenopathy:    He has no cervical adenopathy.  Neurological: He is alert.  Skin: Skin is warm and dry.     Assessment/Plan History of colonic adenomas Surveillance colonoscopy.  Hildred Laser, MD 10/10/2017, 9:29 AM

## 2017-10-10 NOTE — Op Note (Signed)
[facility] Patient Name: Todd Carlson Procedure Date: 10/10/2017 9:00 AM MRN: [id number] Date of Birth: 28-Jun-1945 Attending MD: Hildred Laser , MD CSN: [id number] Age: 72 Admit Type: Outpatient Procedure:                Colonoscopy Indications:              High risk colon cancer surveillance: Personal                            history of colonic polyps Providers:                Hildred Laser, MD, Charlsie Quest. Theda Sers RN, RN, Aram Candela Referring MD:             Manon Hilding, MD Medicines:                Meperidine 50 mg IV, Midazolam 4 mg IV Complications:            No immediate complications. Estimated Blood Loss:     Estimated blood loss was minimal. Procedure:                Pre-Anesthesia Assessment:                           - Prior to the procedure, a History and Physical                            was performed, and patient medications and                            allergies were reviewed. The patient's tolerance of                            previous anesthesia was also reviewed. The risks                            and benefits of the procedure and the sedation                            options and risks were discussed with the patient.                            All questions were answered, and informed consent                            was obtained. Prior Anticoagulants: The patient                            last took aspirin 3 days prior to the procedure.                            ASA Grade Assessment: II - A patient with mild  systemic disease. After reviewing the risks and                            benefits, the patient was deemed in satisfactory                            condition to undergo the procedure.                           After obtaining informed consent, the colonoscope                            was passed under direct vision. Throughout the                            procedure, the  patient's blood pressure, pulse, and                            oxygen saturations were monitored continuously. The                            PCF-H190DL ([id number]) scope was introduced through                            the anus and advanced to the the cecum, identified                            by appendiceal orifice and ileocecal valve. The                            colonoscopy was performed without difficulty. The                            patient tolerated the procedure well. The quality                            of the bowel preparation was excellent. The                            ileocecal valve, appendiceal orifice, and rectum                            were photographed. Scope In: 9:38:49 AM Scope Out: 10:03:55 AM Scope Withdrawal Time: 0 hours 19 minutes 28 seconds  Total Procedure Duration: 0 hours 25 minutes 6 seconds  Findings:      The perianal and digital rectal examinations were normal.      A 6 mm polyp was found in the ascending colon. The polyp was       semi-pedunculated. The polyp was removed with a hot snare. Resection and       retrieval were complete. The pathology specimen was placed into Bottle       Number 1.      A small polyp was found in the ascending colon. The polyp was sessile.       The polyp  was removed with a cold snare. Resection and retrieval were       complete. The pathology specimen was placed into Bottle Number 1.      A few medium-mouthed diverticula were found in the sigmoid colon.      External hemorrhoids were found during retroflexion. The hemorrhoids       were small. Impression:               - One 6 mm polyp in the ascending colon, removed                            with a hot snare. Resected and retrieved.                           - One small polyp in the ascending colon, removed                            with a cold snare. Resected and retrieved.                           - Diverticulosis in the sigmoid colon.                            - External hemorrhoids. Moderate Sedation:      Moderate (conscious) sedation was administered by the endoscopy nurse       and supervised by the endoscopist. The following parameters were       monitored: oxygen saturation, heart rate, blood pressure, CO2       capnography and response to care. Total physician intraservice time was       32 minutes. Recommendation:           - Patient has a contact number available for                            emergencies. The signs and symptoms of potential                            delayed complications were discussed with the                            patient. Return to normal activities tomorrow.                            Written discharge instructions were provided to the                            patient.                           - High fiber diet today.                           - Continue present medications.                           - No aspirin, ibuprofen, naproxen, or other  non-steroidal anti-inflammatory drugs for 7 days.                           - Await pathology results.                           - Repeat colonoscopy in 5 years for surveillance. Procedure Code(s):        --- Professional ---                           [phone], Colonoscopy, flexible; with removal of                            tumor(s), polyp(s), or other lesion(s) by snare                            technique                           G0500, Moderate sedation services provided by the                            same physician or other qualified health care                            professional performing a gastrointestinal                            endoscopic service that sedation supports,                            requiring the presence of an independent trained                            observer to assist in the monitoring of the                            patient's level of consciousness and physiological                             status; initial 15 minutes of intra-service time;                            patient age 56 years or older (additional time may                            be reported with [phone], as appropriate)                           [phone], Moderate sedation services provided by the                            same physician or other qualified health care  professional performing the diagnostic or                            therapeutic service that the sedation supports,                            requiring the presence of an independent trained                            observer to assist in the monitoring of the                            patient's level of consciousness and physiological                            status; each additional 15 minutes intraservice                            time (List separately in addition to code for                            primary service) Diagnosis Code(s):        --- Professional ---                           Z86.010, Personal history of colonic polyps                           D12.2, Benign neoplasm of ascending colon                           K64.4, Residual hemorrhoidal skin tags                           K57.30, Diverticulosis of large intestine without                            perforation or abscess without bleeding CPT copyright 2017 American Medical Association. All rights reserved. The codes documented in this report are preliminary and upon coder review may  be revised to meet current compliance requirements. Hildred Laser, MD Hildred Laser, MD 10/10/2017 10:11:47 AM This report has been signed electronically. Number of Addenda: 0

## 2017-10-10 NOTE — Discharge Instructions (Signed)
No aspirin or NSAIDs for 1 week. Resume other medications as before. High-fiber diet. No driving for 24 hours. Physician will call with biopsy results. Next colonoscopy in 5 years.  Colonoscopy, Adult, Care After This sheet gives you information about how to care for yourself after your procedure. Your doctor may also give you more specific instructions. If you have problems or questions, call your doctor. Follow these instructions at home: General instructions   For the first 24 hours after the procedure: ? Do not drive or use machinery. ? Do not sign important documents. ? Do not drink alcohol. ? Do your daily activities more slowly than normal. ? Eat foods that are soft and easy to digest. ? Rest often.  Take over-the-counter or prescription medicines only as told by your doctor.  It is up to you to get the results of your procedure. Ask your doctor, or the department performing the procedure, when your results will be ready. To help cramping and bloating:  Try walking around.  Put heat on your belly (abdomen) as told by your doctor. Use a heat source that your doctor recommends, such as a moist heat pack or a heating pad. ? Put a towel between your skin and the heat source. ? Leave the heat on for 20-30 minutes. ? Remove the heat if your skin turns bright red. This is especially important if you cannot feel pain, heat, or cold. You can get burned. Eating and drinking  Drink enough fluid to keep your pee (urine) clear or pale yellow.  Return to your normal diet as told by your doctor. Avoid heavy or fried foods that are hard to digest.  Avoid drinking alcohol for as long as told by your doctor. Contact a doctor if:  You have blood in your poop (stool) 2-3 days after the procedure. Get help right away if:  You have more than a small amount of blood in your poop.  You see large clumps of tissue (blood clots) in your poop.  Your belly is swollen.  You feel sick to  your stomach (nauseous).  You throw up (vomit).  You have a fever.  You have belly pain that gets worse, and medicine does not help your pain. This information is not intended to replace advice given to you by your health care provider. Make sure you discuss any questions you have with your health care provider. Document Released: 03/04/2010 Document Revised: 10/25/2015 Document Reviewed: 10/25/2015 Elsevier Interactive Patient Education  2017 [employer].   High-Fiber Diet Fiber, also called dietary fiber, is a type of carbohydrate found in fruits, vegetables, whole grains, and beans. A high-fiber diet can have many health benefits. Your health care provider may recommend a high-fiber diet to help:  Prevent constipation. Fiber can make your bowel movements more regular.  Lower your cholesterol.  Relieve hemorrhoids, uncomplicated diverticulosis, or irritable bowel syndrome.  Prevent overeating as part of a weight-loss plan.  Prevent heart disease, type 2 diabetes, and certain cancers.  What is my plan? The recommended daily intake of fiber includes:  38 grams for men under age 22.  32 grams for men over age 75.  56 grams for women under age 96.  45 grams for women over age 9.  You can get the recommended daily intake of dietary fiber by eating a variety of fruits, vegetables, grains, and beans. Your health care provider may also recommend a fiber supplement if it is not possible to get enough fiber through your diet.  What do I need to know about a high-fiber diet?  Fiber supplements have not been widely studied for their effectiveness, so it is better to get fiber through food sources.  Always check the fiber content on thenutrition facts label of any prepackaged food. Look for foods that contain at least 5 grams of fiber per serving.  Ask your dietitian if you have questions about specific foods that are related to your condition, especially if those foods are not  listed in the following section.  Increase your daily fiber consumption gradually. Increasing your intake of dietary fiber too quickly may cause bloating, cramping, or gas.  Drink plenty of water. Water helps you to digest fiber. What foods can I eat? Grains Whole-grain breads. Multigrain cereal. Oats and oatmeal. Brown rice. Barley. Bulgur wheat. [place]. Bran muffins. Popcorn. Rye wafer crackers. Vegetables Sweet potatoes. Spinach. Kale. Artichokes. Cabbage. Broccoli. Green peas. Carrots. Squash. Fruits Berries. Pears. Apples. Oranges. Avocados. Prunes and raisins. Dried figs. Meats and Other Protein Sources [place], kidney, pinto, and soy beans. Split peas. Lentils. Nuts and seeds. Dairy Fiber-fortified yogurt. Beverages Fiber-fortified soy milk. Fiber-fortified orange juice. Other Fiber bars. The items listed above may not be a complete list of recommended foods or beverages. Contact your dietitian for more options. What foods are not recommended? Grains White bread. Pasta made with refined flour. White rice. Vegetables Fried potatoes. Canned vegetables. Well-cooked vegetables. Fruits Fruit juice. Cooked, strained fruit. Meats and Other Protein Sources Fatty cuts of meat. Fried Sales executive or fried fish. Dairy Milk. Yogurt. Cream cheese. Sour cream. Beverages Soft drinks. Other Cakes and pastries. Butter and oils. The items listed above may not be a complete list of foods and beverages to avoid. Contact your dietitian for more information. What are some tips for including high-fiber foods in my diet?  Eat a wide variety of high-fiber foods.  Make sure that half of all grains consumed each day are whole grains.  Replace breads and cereals made from refined flour or white flour with whole-grain breads and cereals.  Replace white rice with brown rice, bulgur wheat, or millet.  Start the day with a breakfast that is high in fiber, such as a cereal that contains at least 5 grams  of fiber per serving.  Use beans in place of meat in soups, salads, or pasta.  Eat high-fiber snacks, such as berries, raw vegetables, nuts, or popcorn. This information is not intended to replace advice given to you by your health care provider. Make sure you discuss any questions you have with your health care provider. Document Released: 01/30/2005 Document Revised: 07/08/2015 Document Reviewed: 07/15/2013 Elsevier Interactive Patient Education  [employer].

## 2017-10-12 ENCOUNTER — Encounter (HOSPITAL_COMMUNITY): Payer: Self-pay | Admitting: Internal Medicine

## 2017-10-22 DIAGNOSIS — R05 Cough: Secondary | ICD-10-CM | POA: Diagnosis not present

## 2017-10-22 DIAGNOSIS — J0101 Acute recurrent maxillary sinusitis: Secondary | ICD-10-CM | POA: Diagnosis not present

## 2017-12-11 DIAGNOSIS — L821 Other seborrheic keratosis: Secondary | ICD-10-CM | POA: Diagnosis not present

## 2017-12-18 DIAGNOSIS — R739 Hyperglycemia, unspecified: Secondary | ICD-10-CM | POA: Diagnosis not present

## 2017-12-18 DIAGNOSIS — E559 Vitamin D deficiency, unspecified: Secondary | ICD-10-CM | POA: Diagnosis not present

## 2017-12-18 DIAGNOSIS — E782 Mixed hyperlipidemia: Secondary | ICD-10-CM | POA: Diagnosis not present

## 2017-12-18 DIAGNOSIS — R7301 Impaired fasting glucose: Secondary | ICD-10-CM | POA: Diagnosis not present

## 2017-12-21 DIAGNOSIS — Z1389 Encounter for screening for other disorder: Secondary | ICD-10-CM | POA: Diagnosis not present

## 2017-12-21 DIAGNOSIS — E782 Mixed hyperlipidemia: Secondary | ICD-10-CM | POA: Diagnosis not present

## 2017-12-21 DIAGNOSIS — Z23 Encounter for immunization: Secondary | ICD-10-CM | POA: Diagnosis not present

## 2017-12-21 DIAGNOSIS — I25810 Atherosclerosis of coronary artery bypass graft(s) without angina pectoris: Secondary | ICD-10-CM | POA: Diagnosis not present

## 2017-12-21 DIAGNOSIS — R7301 Impaired fasting glucose: Secondary | ICD-10-CM | POA: Diagnosis not present

## 2017-12-21 DIAGNOSIS — I2510 Atherosclerotic heart disease of native coronary artery without angina pectoris: Secondary | ICD-10-CM | POA: Diagnosis not present

## 2018-04-16 DIAGNOSIS — R739 Hyperglycemia, unspecified: Secondary | ICD-10-CM | POA: Diagnosis not present

## 2018-04-16 DIAGNOSIS — R7301 Impaired fasting glucose: Secondary | ICD-10-CM | POA: Diagnosis not present

## 2018-04-16 DIAGNOSIS — I25810 Atherosclerosis of coronary artery bypass graft(s) without angina pectoris: Secondary | ICD-10-CM | POA: Diagnosis not present

## 2018-04-16 DIAGNOSIS — E559 Vitamin D deficiency, unspecified: Secondary | ICD-10-CM | POA: Diagnosis not present

## 2018-04-16 DIAGNOSIS — E782 Mixed hyperlipidemia: Secondary | ICD-10-CM | POA: Diagnosis not present

## 2018-04-18 DIAGNOSIS — E782 Mixed hyperlipidemia: Secondary | ICD-10-CM | POA: Diagnosis not present

## 2018-04-18 DIAGNOSIS — I2510 Atherosclerotic heart disease of native coronary artery without angina pectoris: Secondary | ICD-10-CM | POA: Diagnosis not present

## 2018-04-18 DIAGNOSIS — R7301 Impaired fasting glucose: Secondary | ICD-10-CM | POA: Diagnosis not present

## 2018-04-18 DIAGNOSIS — M545 Low back pain: Secondary | ICD-10-CM | POA: Diagnosis not present

## 2018-06-22 ENCOUNTER — Encounter (HOSPITAL_COMMUNITY): Payer: Self-pay | Admitting: Emergency Medicine

## 2018-06-22 ENCOUNTER — Other Ambulatory Visit: Payer: Self-pay

## 2018-06-22 ENCOUNTER — Emergency Department (HOSPITAL_COMMUNITY)
Admission: EM | Admit: 2018-06-22 | Discharge: 2018-06-22 | Disposition: A | Discharge status: Home / Self Care | Payer: Medicare Other | Attending: Emergency Medicine | Admitting: Emergency Medicine

## 2018-06-22 ENCOUNTER — Emergency Department (HOSPITAL_COMMUNITY): Payer: Medicare Other

## 2018-06-22 DIAGNOSIS — Y92007 Garden or yard of unspecified non-institutional (private) residence as the place of occurrence of the external cause: Secondary | ICD-10-CM | POA: Diagnosis not present

## 2018-06-22 DIAGNOSIS — S6991XA Unspecified injury of right wrist, hand and finger(s), initial encounter: Secondary | ICD-10-CM | POA: Diagnosis present

## 2018-06-22 DIAGNOSIS — Y9389 Activity, other specified: Secondary | ICD-10-CM | POA: Diagnosis not present

## 2018-06-22 DIAGNOSIS — S60221A Contusion of right hand, initial encounter: Secondary | ICD-10-CM

## 2018-06-22 DIAGNOSIS — Z79899 Other long term (current) drug therapy: Secondary | ICD-10-CM | POA: Diagnosis not present

## 2018-06-22 DIAGNOSIS — S60211A Contusion of right wrist, initial encounter: Secondary | ICD-10-CM | POA: Insufficient documentation

## 2018-06-22 DIAGNOSIS — Y998 Other external cause status: Secondary | ICD-10-CM | POA: Diagnosis not present

## 2018-06-22 DIAGNOSIS — I2510 Atherosclerotic heart disease of native coronary artery without angina pectoris: Secondary | ICD-10-CM | POA: Diagnosis not present

## 2018-06-22 DIAGNOSIS — W228XXA Striking against or struck by other objects, initial encounter: Secondary | ICD-10-CM | POA: Diagnosis not present

## 2018-06-22 DIAGNOSIS — M7989 Other specified soft tissue disorders: Secondary | ICD-10-CM | POA: Diagnosis not present

## 2018-06-22 DIAGNOSIS — M19041 Primary osteoarthritis, right hand: Secondary | ICD-10-CM | POA: Diagnosis not present

## 2018-06-22 MED ORDER — ACETAMINOPHEN 500 MG PO TABS
1000.0000 mg | ORAL_TABLET | Freq: Once | ORAL | Status: AC
  Administered 2018-06-22: 1000 mg via ORAL
  Filled 2018-06-22: qty 2

## 2018-06-22 MED ORDER — BACITRACIN-NEOMYCIN-POLYMYXIN 400-5-5000 EX OINT
TOPICAL_OINTMENT | Freq: Once | CUTANEOUS | Status: AC
  Administered 2018-06-22: 3 via TOPICAL
  Filled 2018-06-22: qty 4

## 2018-06-22 MED ORDER — TETANUS-DIPHTH-ACELL PERTUSSIS 5-2.5-18.5 LF-MCG/0.5 IM SUSP
0.5000 mL | Freq: Once | INTRAMUSCULAR | Status: AC
  Administered 2018-06-22: 0.5 mL via INTRAMUSCULAR
  Filled 2018-06-22: qty 0.5

## 2018-06-22 NOTE — ED Provider Notes (Signed)
[facility] EMERGENCY DEPARTMENT Provider Note   CSN: [id number] Arrival date & time: 06/22/18  1818    History   Chief Complaint Chief Complaint  Patient presents with  . Hand Injury    HPI Todd Carlson is a 73 y.o. male.     Patient is a 73 year old male who presents to the emergency department with a complaint of injury to the right hand.  The patient states that he was covering plants for the frost tonight.  He was wearing gloves.  When he took off his gloves he noted bruising and abrasion to the knuckles of the right hand.  Patient was concerned as to possible spider bite or snake bite and he came to the emergency department for evaluation.  He says he noted swelling, but only at the knuckle area.  He denies any difficulty with breathing.  No swelling of his mouth or throat area.  He has not had this type of issue before.  The been no other injuries that he is aware of.    The history is provided by the patient.    Past Medical History:  Diagnosis Date  . Coronary artery disease     Patient Active Problem List   Diagnosis Date Noted  . Hx of colonic polyps 08/03/2017    Past Surgical History:  Procedure Laterality Date  . COLONOSCOPY N/A 10/10/2017   Procedure: COLONOSCOPY;  Surgeon: Rogene Houston, MD;  [facility];  Service: Endoscopy;  Laterality: N/A;  930  . CORONARY ARTERY BYPASS GRAFT    . POLYPECTOMY  10/10/2017   Procedure: POLYPECTOMY;  Surgeon: Rogene Houston, MD;  Location: [facility];  Service: Endoscopy;;  . TONSILLECTOMY          Home Medications    Prior to Admission medications   Medication Sig Start Date End Date Taking? Authorizing Provider  Acetylcarnitine HCl (ACETYL L-CARNITINE) 500 MG CAPS Take 1,000 mg by mouth 2 (two) times daily.    [provider]  Ascorbyl Palmitate POWD Take 1,000 mg by mouth 2 (two) times daily.    [provider]  aspirin EC 81 MG tablet Take 1 tablet (81 mg total) by  mouth daily. 10/17/17   Rehman, Mechele Dawley, MD  atorvastatin (LIPITOR) 20 MG tablet Take 20 mg by mouth at bedtime.    [provider]  Barberry-Oreg Grape-Goldenseal (BERBERINE COMPLEX PO) Take 800-1,200 mg by mouth See admin instructions. Take 1200 mg by mouth in the morning and take 800 mg by mouth at bedtime    [provider]  Cholecalciferol (VITAMIN D3) 10000 units TABS Take 10,000 Units by mouth 2 (two) times daily.    [provider]  Coenzyme Q10 (CO Q-10) 300 MG CAPS Take 300 mg by mouth 2 (two) times daily.    [provider]  Garlic 2025 MG CAPS Take 1,200 mg by mouth 2 (two) times daily.    [provider]  Magnesium (CVS TRIPLE MAGNESIUM COMPLEX) 400 MG CAPS Take 2,000 mg by mouth daily.    [provider]  MALIC ACID PO Take 427 mg by mouth 2 (two) times daily.    [provider]  metoprolol tartrate (LOPRESSOR) 25 MG tablet Take 12.5 mg by mouth 2 (two) times daily.    [provider]  Misc Natural Products (BETA-SITOSTEROL PLANT STEROLS PO) Take 400 mg by mouth 2 (two) times daily.    [provider]  Multiple Vitamins-Minerals (MULTIVITAMIN PO) Take 1  tablet by mouth daily.    [provider]  Omega-3 Fatty Acids (FISH OIL PO) Take 5,400 mg by mouth 2 (two) times daily.    [provider]  PANTETHINE PO Take 600 mg by mouth 2 (two) times daily.    [provider]  PHOSPHATIDYL CHOLINE PO Take 1,260 mg by mouth 2 (two) times daily.    [provider]  Probiotic Product (PROBIOTIC PO) Take 1 capsule by mouth at bedtime.    [provider]  Taurine 500 MG CAPS Take 500 mg by mouth 2 (two) times daily.    [provider]  thiamine (VITAMIN B-1) 100 MG tablet Take 100 mg by mouth daily.    [provider]  Turmeric 500 MG CAPS Take 1,000 mg by mouth 2 (two) times daily.    [provider]  VITAMIN K PO Take 2,000 mcg by mouth daily.     [provider]    Family History No family history on file.  Social History Social History   Tobacco Use  . Smoking status: Never Smoker  . Smokeless tobacco: Never Used  Substance Use Topics  . Alcohol use: Not Currently  . Drug use: Not Currently     Allergies   Penicillins   Review of Systems Review of Systems  Constitutional: Negative for activity change.       All ROS Neg except as noted in HPI  HENT: Negative for nosebleeds.   Eyes: Negative for photophobia and discharge.  Respiratory: Negative for cough, shortness of breath and wheezing.   Cardiovascular: Negative for chest pain and palpitations.  Gastrointestinal: Negative for abdominal pain and blood in stool.  Genitourinary: Negative for dysuria, frequency and hematuria.  Musculoskeletal: Negative for arthralgias, back pain and neck pain.       Hand pain  Skin: Negative.   Neurological: Negative for dizziness, seizures and speech difficulty.  Psychiatric/Behavioral: Negative for confusion and hallucinations.     Physical Exam Updated Vital Signs BP (!) 158/71 (BP Location: Right Arm)   Pulse 67   Temp 98.4 F (36.9 C) (Oral)   Resp 14   Ht 5\' 7"  (1.702 m)   Wt 81.6 kg   SpO2 98%   BMI 28.19 kg/m   Physical Exam Vitals signs and nursing note reviewed.  Constitutional:      Appearance: He is well-developed. He is not toxic-appearing.  HENT:     Head: Normocephalic.     Right Ear: Tympanic membrane and external ear normal.     Left Ear: Tympanic membrane and external ear normal.     Mouth/Throat:     Comments: The oropharynx is clear.  No facial swelling, no swelling of the posterior pharynx.  The airway is patent. Eyes:     General: Lids are normal.     Pupils: Pupils are equal, round, and reactive to light.  Neck:     Musculoskeletal: Normal range of motion and neck supple.     Vascular: No carotid bruit.  Cardiovascular:     Rate and Rhythm: Normal rate and regular rhythm.      Pulses: Normal pulses.     Heart sounds: Normal heart sounds.  Pulmonary:     Effort: No respiratory distress.     Breath sounds: Normal breath sounds.     Comments: Lungs are clear.  There is symmetrical rise and fall of the chest.  No wheezes appreciated.  Patient speaks in complete sentences without problem. Abdominal:  General: Bowel sounds are normal.     Palpations: Abdomen is soft.     Tenderness: There is no abdominal tenderness. There is no guarding.  Musculoskeletal: Normal range of motion.     Comments: There is full range of motion of the right shoulder and elbow.  There is no palpable nodes of the bicep tricep area.  There is no swelling of the forearm or wrist area.  The compartments are soft.  There is full range of motion of the wrist.  There are some degenerative changes of the right wrist and hand.  There is an abrasion and bruise of the second and third MP joint on the right.  Full range of motion of the fingers of the right hand.  There is no swelling of the fingers.  Capillary refill is less than 2 seconds.  There are no lesions between the fingers in the web spaces.  Lymphadenopathy:     Head:     Right side of head: No submandibular adenopathy.     Left side of head: No submandibular adenopathy.     Cervical: No cervical adenopathy.  Skin:    General: Skin is warm and dry.  Neurological:     Mental Status: He is alert and oriented to person, place, and time.     Cranial Nerves: No cranial nerve deficit.     Sensory: No sensory deficit.  Psychiatric:        Speech: Speech normal.      ED Treatments / Results  Labs (all labs ordered are listed, but only abnormal results are displayed) Labs Reviewed - No data to display  EKG None  Radiology No results found.  Procedures Procedures (including critical care time)  Medications Ordered in ED Medications  Tdap (BOOSTRIX) injection 0.5 mL (has no administration in time range)  acetaminophen (TYLENOL)  tablet 1,000 mg (has no administration in time range)     Initial Impression / Assessment and Plan / ED Course  I have reviewed the triage vital signs and the nursing notes.  Pertinent labs & imaging results that were available during my care of the patient were reviewed by me and considered in my medical decision making (see chart for details).         Final Clinical Impressions(s) / ED Diagnoses MDM  Blood pressure is slightly elevated, otherwise vital signs are within normal limits.  Pulse oximetry is 98% on room air.  No neurovascular deficits of the right hand.  Patient has full range of motion of the fingers.  There is swelling only at the sites of abrasion.  There is no palpable nodes in the bicep tricep area.  There is no red streaking going up the arm.  X-ray of the right hand shows no fracture or dislocation.  There is mild osteoarthritis present.  There is soft tissue swelling about the dorsum of the hand overlying the MP joints.  No foreign body is appreciated.  Tetanus status was updated.  The patient will have Neosporin dressing applied to the MP joint areas.  Patient will use Tylenol every 4 hours for discomfort.  Patient will return to the emergency department if any changes in his condition, problems, or concerns.   Final diagnoses:  Contusion of right hand, initial encounter    ED Discharge Orders    None       Lily Kocher, Hershal Coria 06/22/18 2010    Isla Pence, MD 06/22/18 2035

## 2018-06-22 NOTE — ED Triage Notes (Signed)
Pt reports swelling and bruising to top of RT hand after moving plants. Pt reports he was wearing gloves. Pt unsure if he was bitten by a snake or spider. Pt did not feel any pain. States he did not realize his hand was swollen until he went inside. Denies any pain at this time.

## 2018-06-22 NOTE — Discharge Instructions (Addendum)
The neurologic and vascular exam are negative concerning your right hand.  The x-ray is negative for fracture, dislocation, or foreign body.  Your examination is consistent with a contusion and abrasion of your hand.  There is no excessive swelling to suggest envenomation by insect or reptile.  Please cleanse the wound daily with soap and water.  Apply dressing daily.  Use Tylenol every 4 hours as needed for soreness.  Please see your primary physician or return to the emergency department if any changes in your condition, worsening of your symptoms, problems, or concerns.

## 2018-07-19 ENCOUNTER — Emergency Department (HOSPITAL_COMMUNITY)
Admission: EM | Admit: 2018-07-19 | Discharge: 2018-07-19 | Disposition: A | Discharge status: Home / Self Care | Payer: Medicare Other | Attending: Emergency Medicine | Admitting: Emergency Medicine

## 2018-07-19 ENCOUNTER — Encounter (HOSPITAL_COMMUNITY): Payer: Self-pay | Admitting: Emergency Medicine

## 2018-07-19 ENCOUNTER — Emergency Department (HOSPITAL_COMMUNITY): Payer: Medicare Other

## 2018-07-19 ENCOUNTER — Other Ambulatory Visit: Payer: Self-pay

## 2018-07-19 DIAGNOSIS — Y9389 Activity, other specified: Secondary | ICD-10-CM | POA: Insufficient documentation

## 2018-07-19 DIAGNOSIS — S30811A Abrasion of abdominal wall, initial encounter: Secondary | ICD-10-CM | POA: Diagnosis not present

## 2018-07-19 DIAGNOSIS — S299XXA Unspecified injury of thorax, initial encounter: Secondary | ICD-10-CM | POA: Diagnosis present

## 2018-07-19 DIAGNOSIS — S20212A Contusion of left front wall of thorax, initial encounter: Secondary | ICD-10-CM | POA: Diagnosis not present

## 2018-07-19 DIAGNOSIS — W19XXXA Unspecified fall, initial encounter: Secondary | ICD-10-CM | POA: Insufficient documentation

## 2018-07-19 DIAGNOSIS — Z79899 Other long term (current) drug therapy: Secondary | ICD-10-CM | POA: Insufficient documentation

## 2018-07-19 DIAGNOSIS — Z7982 Long term (current) use of aspirin: Secondary | ICD-10-CM | POA: Diagnosis not present

## 2018-07-19 DIAGNOSIS — Z951 Presence of aortocoronary bypass graft: Secondary | ICD-10-CM | POA: Diagnosis not present

## 2018-07-19 DIAGNOSIS — Y999 Unspecified external cause status: Secondary | ICD-10-CM | POA: Diagnosis not present

## 2018-07-19 DIAGNOSIS — Y9289 Other specified places as the place of occurrence of the external cause: Secondary | ICD-10-CM | POA: Insufficient documentation

## 2018-07-19 DIAGNOSIS — I2510 Atherosclerotic heart disease of native coronary artery without angina pectoris: Secondary | ICD-10-CM | POA: Insufficient documentation

## 2018-07-19 NOTE — ED Triage Notes (Signed)
Pt states that he fell off of a truck this morning he has road rash on his chest he states that it just feels bruised and wants to get checked out

## 2018-07-19 NOTE — ED Provider Notes (Signed)
[facility] EMERGENCY DEPARTMENT Provider Note   CSN: [id number] Arrival date & time: 07/19/18  1216    History   Chief Complaint Chief Complaint  Patient presents with  . Fall    HPI Todd Carlson is a 73 y.o. male with past medical history of CAD status post CABG in 2015, presenting to the emergency department after a mechanical fall.  Patient states he was standing in the bed of his truck, trying to get his lawnmower down a ramp and he states the lawnmower let loose and he was pulled forward, scraping his chest and abdomen on the ramp.  He did not hit his head.  He has abrasions to the left chest and upper abdomen that is worse with palpation.  He states his chest hit the ramp first, and then his abdomen was more scraped.  He has no difficulty or pain with breathing.  He states he is always been somewhat tender in his chest wall after the CABG.  No interventions tried prior to arrival.  Last tetanus immunization was last month.  He takes aspirin daily, however is not on anticoagulation.     The history is provided by the patient.    Past Medical History:  Diagnosis Date  . Coronary artery disease     Patient Active Problem List   Diagnosis Date Noted  . Hx of colonic polyps 08/03/2017    Past Surgical History:  Procedure Laterality Date  . COLONOSCOPY N/A 10/10/2017   Procedure: COLONOSCOPY;  Surgeon: Rogene Houston, MD;  [facility];  Service: Endoscopy;  Laterality: N/A;  930  . CORONARY ARTERY BYPASS GRAFT    . POLYPECTOMY  10/10/2017   Procedure: POLYPECTOMY;  Surgeon: Rogene Houston, MD;  [facility];  Service: Endoscopy;;  . TONSILLECTOMY          Home Medications    Prior to Admission medications   Medication Sig Start Date End Date Taking? Authorizing Provider  Acetylcarnitine HCl (ACETYL L-CARNITINE) 500 MG CAPS Take 1,000 mg by mouth 2 (two) times daily.    [provider]  Ascorbyl Palmitate POWD Take 1,000 mg by  mouth 2 (two) times daily.    [provider]  aspirin EC 81 MG tablet Take 1 tablet (81 mg total) by mouth daily. 10/17/17   Rehman, Mechele Dawley, MD  atorvastatin (LIPITOR) 20 MG tablet Take 20 mg by mouth at bedtime.    [provider]  Barberry-Oreg Grape-Goldenseal (BERBERINE COMPLEX PO) Take 800-1,200 mg by mouth See admin instructions. Take 1200 mg by mouth in the morning and take 800 mg by mouth at bedtime    [provider]  Cholecalciferol (VITAMIN D3) 10000 units TABS Take 10,000 Units by mouth 2 (two) times daily.    [provider]  Coenzyme Q10 (CO Q-10) 300 MG CAPS Take 300 mg by mouth 2 (two) times daily.    [provider]  Garlic 4332 MG CAPS Take 1,200 mg by mouth 2 (two) times daily.    [provider]  Magnesium (CVS TRIPLE MAGNESIUM COMPLEX) 400 MG CAPS Take 2,000 mg by mouth daily.    [provider]  MALIC ACID PO Take 951 mg by mouth 2 (two) times daily.    [provider]  metoprolol tartrate (LOPRESSOR) 25 MG tablet Take 12.5 mg by mouth 2 (two) times daily.    [provider]  Misc Natural Products (BETA-SITOSTEROL PLANT STEROLS PO) Take 400 mg by mouth  2 (two) times daily.    [provider]  Multiple Vitamins-Minerals (MULTIVITAMIN PO) Take 1 tablet by mouth daily.    [provider]  Omega-3 Fatty Acids (FISH OIL PO) Take 5,400 mg by mouth 2 (two) times daily.    [provider]  PANTETHINE PO Take 600 mg by mouth 2 (two) times daily.    [provider]  PHOSPHATIDYL CHOLINE PO Take 1,260 mg by mouth 2 (two) times daily.    [provider]  Probiotic Product (PROBIOTIC PO) Take 1 capsule by mouth at bedtime.    [provider]  Taurine 500 MG CAPS Take 500 mg by mouth 2 (two) times daily.    [provider]  thiamine (VITAMIN B-1) 100 MG tablet Take 100 mg by mouth daily.    [provider]  Turmeric 500 MG CAPS Take 1,000  mg by mouth 2 (two) times daily.    [provider]  VITAMIN K PO Take 2,000 mcg by mouth daily.    [provider]    Family History History reviewed. No pertinent family history.  Social History Social History   Tobacco Use  . Smoking status: Never Smoker  . Smokeless tobacco: Never Used  Substance Use Topics  . Alcohol use: Not Currently  . Drug use: Not Currently     Allergies   Penicillins   Review of Systems Review of Systems  All other systems reviewed and are negative.    Physical Exam Updated Vital Signs BP (!) 160/67 (BP Location: Right Arm)   Pulse 74   Temp 98 F (36.7 C) (Oral)   Resp 16   Ht 5\' 7"  (1.702 m)   Wt 82.6 kg   SpO2 98%   BMI 28.51 kg/m   Physical Exam Vitals signs and nursing note reviewed.  Constitutional:      General: He is not in acute distress.    Appearance: He is well-developed.  HENT:     Head: Normocephalic and atraumatic.  Eyes:     Conjunctiva/sclera: Conjunctivae normal.  Cardiovascular:     Rate and Rhythm: Normal rate and regular rhythm.  Pulmonary:     Effort: Pulmonary effort is normal. No respiratory distress.     Breath sounds: Normal breath sounds.  Chest:       Comments: He has abrasions to the left anterior chest wall with associated tenderness.  No crepitus.  Symmetric chest expansion.  There is also some tenderness to the lower anterior chest wall.  Midline surgical scar. Abdominal:     General: Bowel sounds are normal. There is no distension.       Comments: Abdomen with large abrasion, however no tenderness with deep palpation, no guarding or rebound.  Neurological:     Mental Status: He is alert.  Psychiatric:        Mood and Affect: Mood normal.        Behavior: Behavior normal.      ED Treatments / Results  Labs (all labs ordered are listed, but only abnormal results are displayed) Labs Reviewed - No data to display  EKG None  Radiology Dg Chest 2 View  Result  Date: 07/19/2018 CLINICAL DATA:  [place] off the back of his truck while unloading a lawnmower, chest rock ramp, chest abrasions post fall EXAM: CHEST - 2 VIEW COMPARISON:  None FINDINGS: Upper normal heart size post CABG. Mediastinal contours and pulmonary vascularity normal. Lungs clear. No pulmonary infiltrate, pleural effusion or pneumothorax. No  acute osseous findings. IMPRESSION: Post CABG. No acute abnormalities. Electronically Signed   By: Lavonia Dana M.D.   On: 07/19/2018 12:59    Procedures Procedures (including critical care time)  Medications Ordered in ED Medications - No data to display   Initial Impression / Assessment and Plan / ED Course  I have reviewed the triage vital signs and the nursing notes.  Pertinent labs & imaging results that were available during my care of the patient were reviewed by me and considered in my medical decision making (see chart for details).        Patient with mechanical fall and abrasion/contusion to left chest wall and upper abdomen.  Patient is not on anticoagulation.  No difficulty or pain with breathing.  There is tenderness to the anterior chest wall, however abdomen is benign with palpation.  Chest x-ray obtained in triage is negative.  Recommend CT scan for further evaluation given patient's tenderness, however declined and stating he would rather treat his symptoms at home, and follow-up with PCP.  This was discussed with Dr. Roderic Palau.  Patient discharged with symptomatic management and strict return precautions.  Discussed results, findings, treatment and follow up. Patient advised of return precautions. Patient verbalized understanding and agreed with plan.   Final Clinical Impressions(s) / ED Diagnoses   Final diagnoses:  Contusion of left chest wall, initial encounter  Abrasion of abdominal wall, initial encounter    ED Discharge Orders    None       Louann Hopson, [place] N, PA-C 07/19/18 1443    Milton Ferguson, MD 07/20/18  1628

## 2018-07-19 NOTE — Discharge Instructions (Addendum)
Apply ice for 20 minutes at a time. Take over-the-counter medication, such as Tylenol, every 4-6 hours as needed for pain. You can apply an antibiotic ointment to your abrasion. Follow closely with your primary care provider. We discussed a CT scan today, however you would prefer to treat your symptoms and follow-up your primary care doctor.  If you develop shortness of breath, abdominal pain, or new or worsening symptoms, please return to the emergency department.

## 2018-08-15 DIAGNOSIS — R7301 Impaired fasting glucose: Secondary | ICD-10-CM | POA: Diagnosis not present

## 2018-08-15 DIAGNOSIS — E782 Mixed hyperlipidemia: Secondary | ICD-10-CM | POA: Diagnosis not present

## 2018-08-20 DIAGNOSIS — E785 Hyperlipidemia, unspecified: Secondary | ICD-10-CM | POA: Diagnosis not present

## 2018-08-20 DIAGNOSIS — I2510 Atherosclerotic heart disease of native coronary artery without angina pectoris: Secondary | ICD-10-CM | POA: Diagnosis not present

## 2018-08-21 DIAGNOSIS — E782 Mixed hyperlipidemia: Secondary | ICD-10-CM | POA: Diagnosis not present

## 2018-08-21 DIAGNOSIS — I2510 Atherosclerotic heart disease of native coronary artery without angina pectoris: Secondary | ICD-10-CM | POA: Diagnosis not present

## 2018-08-21 DIAGNOSIS — R7301 Impaired fasting glucose: Secondary | ICD-10-CM | POA: Diagnosis not present

## 2018-08-21 DIAGNOSIS — M545 Low back pain: Secondary | ICD-10-CM | POA: Diagnosis not present

## 2018-08-21 DIAGNOSIS — Z0001 Encounter for general adult medical examination with abnormal findings: Secondary | ICD-10-CM | POA: Diagnosis not present

## 2018-12-18 DIAGNOSIS — R7301 Impaired fasting glucose: Secondary | ICD-10-CM | POA: Diagnosis not present

## 2018-12-18 DIAGNOSIS — E559 Vitamin D deficiency, unspecified: Secondary | ICD-10-CM | POA: Diagnosis not present

## 2018-12-18 DIAGNOSIS — R739 Hyperglycemia, unspecified: Secondary | ICD-10-CM | POA: Diagnosis not present

## 2018-12-18 DIAGNOSIS — E782 Mixed hyperlipidemia: Secondary | ICD-10-CM | POA: Diagnosis not present

## 2018-12-24 DIAGNOSIS — Z23 Encounter for immunization: Secondary | ICD-10-CM | POA: Diagnosis not present

## 2018-12-24 DIAGNOSIS — Z0001 Encounter for general adult medical examination with abnormal findings: Secondary | ICD-10-CM | POA: Diagnosis not present

## 2018-12-24 DIAGNOSIS — M7650 Patellar tendinitis, unspecified knee: Secondary | ICD-10-CM | POA: Diagnosis not present

## 2018-12-24 DIAGNOSIS — Z1389 Encounter for screening for other disorder: Secondary | ICD-10-CM | POA: Diagnosis not present

## 2018-12-24 DIAGNOSIS — I25810 Atherosclerosis of coronary artery bypass graft(s) without angina pectoris: Secondary | ICD-10-CM | POA: Diagnosis not present

## 2018-12-24 DIAGNOSIS — R7989 Other specified abnormal findings of blood chemistry: Secondary | ICD-10-CM | POA: Diagnosis not present

## 2019-01-07 ENCOUNTER — Other Ambulatory Visit: Payer: Self-pay

## 2019-01-07 NOTE — Patient Outreach (Signed)
[facility]) Care Management  01/07/2019  [place] [facility] [birth year] [id number]   Medication Adherence call to Mr. Keyvin Eavey Telephone call to Patient regarding Medication Adherence unable to reach patient. Mr. Sarkisyan is showing past due on Rosuvastatin 5 mg under [place].   [place] Management Direct Dial [phone]  Fax [phone] [email or web address]

## 2019-01-15 ENCOUNTER — Other Ambulatory Visit: Payer: Self-pay

## 2019-01-15 NOTE — Patient Outreach (Signed)
[name]) Care Management  01/15/2019  [name] [facility] 04-12-1945 [id number]   Medication Adherence call to Mr. Todd Carlson [place] Identifiers Verify spoke with patient wife she explain patient is no longer taking Rosuvastatin 5 mg doctor discontinued this medication,patient is now taking a different medication for cholesterol.[name] is showing past due under [place].   [place] Management Direct Dial [phone]  Fax [phone] [email or web address]

## 2019-05-04 ENCOUNTER — Other Ambulatory Visit: Payer: Self-pay

## 2019-05-04 ENCOUNTER — Emergency Department (HOSPITAL_COMMUNITY): Payer: Medicare Other

## 2019-05-04 ENCOUNTER — Encounter (HOSPITAL_COMMUNITY): Payer: Self-pay | Admitting: Emergency Medicine

## 2019-05-04 ENCOUNTER — Emergency Department (HOSPITAL_COMMUNITY)
Admission: EM | Admit: 2019-05-04 | Discharge: 2019-05-04 | Disposition: A | Discharge status: Home / Self Care | Payer: Medicare Other | Attending: Emergency Medicine | Admitting: Emergency Medicine

## 2019-05-04 DIAGNOSIS — K5792 Diverticulitis of intestine, part unspecified, without perforation or abscess without bleeding: Secondary | ICD-10-CM | POA: Diagnosis not present

## 2019-05-04 DIAGNOSIS — Z951 Presence of aortocoronary bypass graft: Secondary | ICD-10-CM | POA: Insufficient documentation

## 2019-05-04 DIAGNOSIS — Z79899 Other long term (current) drug therapy: Secondary | ICD-10-CM | POA: Insufficient documentation

## 2019-05-04 DIAGNOSIS — I2510 Atherosclerotic heart disease of native coronary artery without angina pectoris: Secondary | ICD-10-CM | POA: Insufficient documentation

## 2019-05-04 DIAGNOSIS — Z7982 Long term (current) use of aspirin: Secondary | ICD-10-CM | POA: Diagnosis not present

## 2019-05-04 DIAGNOSIS — R1032 Left lower quadrant pain: Secondary | ICD-10-CM | POA: Diagnosis present

## 2019-05-04 DIAGNOSIS — R109 Unspecified abdominal pain: Secondary | ICD-10-CM | POA: Diagnosis not present

## 2019-05-04 LAB — BASIC METABOLIC PANEL
Anion gap: 8 (ref 5–15)
BUN: 12 mg/dL (ref 8–23)
CO2: 23 mmol/L (ref 22–32)
Calcium: 9.3 mg/dL (ref 8.9–10.3)
Chloride: 105 mmol/L (ref 98–111)
Creatinine, Ser: 0.8 mg/dL (ref 0.61–1.24)
GFR calc Af Amer: >60 mL/min (ref >60)
GFR calc non Af Amer: >60 mL/min (ref >60)
Glucose, Bld: 135 mg/dL — ABNORMAL HIGH (ref 70–99)
Potassium: 3.9 mmol/L (ref 3.5–5.1)
Sodium: 136 mmol/L (ref 135–145)

## 2019-05-04 LAB — HEPATIC FUNCTION PANEL
ALT: 45 U/L — ABNORMAL HIGH (ref 0–44)
AST: 28 U/L (ref 15–41)
Albumin: 3.9 g/dL (ref 3.5–5.0)
Alkaline Phosphatase: 56 U/L (ref 38–126)
Bilirubin, Direct: 0.1 mg/dL (ref 0.0–0.2)
Indirect Bilirubin: 0.8 mg/dL (ref 0.3–0.9)
Total Bilirubin: 0.9 mg/dL (ref 0.3–1.2)
Total Protein: 6.9 g/dL (ref 6.5–8.1)

## 2019-05-04 LAB — CBC
HCT: 41.5 % (ref 39.0–52.0)
Hemoglobin: 14.5 g/dL (ref 13.0–17.0)
MCH: 33.6 pg (ref 26.0–34.0)
MCHC: 34.9 g/dL (ref 30.0–36.0)
MCV: 96.1 fL (ref 80.0–100.0)
Platelets: 160 10*3/uL (ref 150–400)
RBC: 4.32 MIL/uL (ref 4.22–5.81)
RDW: 12 % (ref 11.5–15.5)
WBC: 9.3 10*3/uL (ref 4.0–10.5)
nRBC: 0 % (ref 0.0–0.2)

## 2019-05-04 LAB — LIPASE, BLOOD: Lipase: 17 U/L (ref 11–51)

## 2019-05-04 MED ORDER — HYDROCODONE-ACETAMINOPHEN 5-325 MG PO TABS: 1.0000 | ORAL_TABLET | Freq: Four times a day (QID) | ORAL | 0 refills | Status: AC | PRN

## 2019-05-04 MED ORDER — IOHEXOL 300 MG/ML  SOLN
100.0000 mL | Freq: Once | INTRAMUSCULAR | Status: AC | PRN
  Administered 2019-05-04: 100 mL via INTRAVENOUS

## 2019-05-04 MED ORDER — HYDROMORPHONE HCL 1 MG/ML IJ SOLN
0.5000 mg | Freq: Once | INTRAMUSCULAR | Status: AC
  Administered 2019-05-04: 0.5 mg via INTRAVENOUS
  Filled 2019-05-04: qty 1

## 2019-05-04 MED ORDER — ONDANSETRON HCL 4 MG/2ML IJ SOLN
4.0000 mg | Freq: Once | INTRAMUSCULAR | Status: AC
  Administered 2019-05-04: 4 mg via INTRAVENOUS
  Filled 2019-05-04: qty 2

## 2019-05-04 MED ORDER — CIPROFLOXACIN HCL 250 MG PO TABS
500.0000 mg | ORAL_TABLET | Freq: Once | ORAL | Status: AC
  Administered 2019-05-04: 500 mg via ORAL
  Filled 2019-05-04: qty 2

## 2019-05-04 MED ORDER — METRONIDAZOLE 500 MG PO TABS: 500.0000 mg | ORAL_TABLET | Freq: Four times a day (QID) | ORAL | 0 refills | Status: AC

## 2019-05-04 MED ORDER — CIPROFLOXACIN HCL 500 MG PO TABS: 500.0000 mg | ORAL_TABLET | Freq: Two times a day (BID) | ORAL | 0 refills | Status: AC

## 2019-05-04 MED ORDER — METRONIDAZOLE 500 MG PO TABS
500.0000 mg | ORAL_TABLET | Freq: Once | ORAL | Status: AC
  Administered 2019-05-04: 500 mg via ORAL
  Filled 2019-05-04: qty 1

## 2019-05-04 MED ORDER — ONDANSETRON 4 MG PO TBDP: ORAL_TABLET | ORAL | 0 refills | Status: AC

## 2019-05-04 NOTE — ED Triage Notes (Signed)
Patient complaining of abdominal pain on the left side. Patient states pain started this am. Patient denies any nausea or vomiting.

## 2019-05-04 NOTE — ED Provider Notes (Signed)
[facility] EMERGENCY DEPARTMENT Provider Note   CSN: [id number] Arrival date & time: 05/04/19  1808     History Chief Complaint  Patient presents with  . Abdominal Pain    left side    Todd Carlson is a 74 y.o. male.  Patient complains of left lower quadrant pain.  The history is provided by the patient. No language interpreter was used.  Abdominal Pain Pain location:  LLQ Pain quality: aching   Pain radiates to:  Does not radiate Pain severity:  Moderate Onset quality:  Sudden Timing:  Constant Progression:  Worsening Chronicity:  New Context: not alcohol use   Relieved by:  Nothing Associated symptoms: no chest pain, no cough, no diarrhea, no fatigue and no hematuria        Past Medical History:  Diagnosis Date  . Coronary artery disease     Patient Active Problem List   Diagnosis Date Noted  . Hx of colonic polyps 08/03/2017    Past Surgical History:  Procedure Laterality Date  . COLONOSCOPY N/A 10/10/2017   Procedure: COLONOSCOPY;  Surgeon: Rogene Houston, MD;  Location: [facility] ENDO SUITE;  Service: Endoscopy;  Laterality: N/A;  930  . CORONARY ARTERY BYPASS GRAFT    . POLYPECTOMY  10/10/2017   Procedure: POLYPECTOMY;  Surgeon: Rogene Houston, MD;  Location: [facility] ENDO SUITE;  Service: Endoscopy;;  . TONSILLECTOMY         History reviewed. No pertinent family history.  Social History   Tobacco Use  . Smoking status: Never Smoker  . Smokeless tobacco: Never Used  Substance Use Topics  . Alcohol use: Not Currently  . Drug use: Not Currently    Home Medications Prior to Admission medications   Medication Sig Start Date End Date Taking? Authorizing Provider  metoprolol succinate (TOPROL-XL) 25 MG 24 hr tablet Take 12.5 mg by mouth daily. 02/24/19  Yes [provider]  PRALUENT 75 MG/ML SOAJ Inject 1 pen into the skin every 14 (fourteen) days. 04/21/19  Yes [provider]  Acetylcarnitine HCl (ACETYL L-CARNITINE) 500 MG CAPS  Take 1,000 mg by mouth 2 (two) times daily.    [provider]  Ascorbyl Palmitate POWD Take 1,000 mg by mouth 2 (two) times daily.    [provider]  aspirin EC 81 MG tablet Take 1 tablet (81 mg total) by mouth daily. 10/17/17   Rehman, Mechele Dawley, MD  atorvastatin (LIPITOR) 20 MG tablet Take 20 mg by mouth at bedtime.    [provider]  Barberry-Oreg Grape-Goldenseal (BERBERINE COMPLEX PO) Take 800-1,200 mg by mouth See admin instructions. Take 1200 mg by mouth in the morning and take 800 mg by mouth at bedtime    [provider]  Cholecalciferol (VITAMIN D3) 10000 units TABS Take 10,000 Units by mouth 2 (two) times daily.    [provider]  ciprofloxacin (CIPRO) 500 MG tablet Take 1 tablet (500 mg total) by mouth 2 (two) times daily. One po bid x 7 days 05/04/19   Milton Ferguson, MD  Coenzyme Q10 (CO Q-10) 300 MG CAPS Take 300 mg by mouth 2 (two) times daily.    [provider]  Garlic 0000000 MG CAPS Take 1,200 mg by mouth 2 (two) times daily.    [provider]  HYDROcodone-acetaminophen (NORCO/VICODIN) 5-325 MG tablet Take 1 tablet by mouth every 6 (six) hours as needed for moderate pain. 05/04/19   Milton Ferguson, MD  Magnesium ([employer] TRIPLE MAGNESIUM COMPLEX) 400 MG CAPS  Take 2,000 mg by mouth daily.    [provider]  MALIC ACID PO Take 0000000 mg by mouth 2 (two) times daily.    [provider]  metoprolol tartrate (LOPRESSOR) 25 MG tablet Take 12.5 mg by mouth 2 (two) times daily.    [provider]  metroNIDAZOLE (FLAGYL) 500 MG tablet Take 1 tablet (500 mg total) by mouth 4 (four) times daily. 05/04/19   Milton Ferguson, MD  Misc Natural Products (BETA-SITOSTEROL PLANT STEROLS PO) Take 400 mg by mouth 2 (two) times daily.    [provider]  Multiple Vitamins-Minerals (MULTIVITAMIN PO) Take 1 tablet by mouth daily.    [provider]  Omega-3 Fatty Acids (FISH OIL PO) Take 5,400 mg by mouth 2  (two) times daily.    [provider]  ondansetron (ZOFRAN ODT) 4 MG disintegrating tablet 4mg  ODT q4 hours prn nausea/vomit 05/04/19   Milton Ferguson, MD  PANTETHINE PO Take 600 mg by mouth 2 (two) times daily.    [provider]  PHOSPHATIDYL CHOLINE PO Take 1,260 mg by mouth 2 (two) times daily.    [provider]  Probiotic Product (PROBIOTIC PO) Take 1 capsule by mouth at bedtime.    [provider]  Taurine 500 MG CAPS Take 500 mg by mouth 2 (two) times daily.    [provider]  thiamine (VITAMIN B-1) 100 MG tablet Take 100 mg by mouth daily.    [provider]  Turmeric 500 MG CAPS Take 1,000 mg by mouth 2 (two) times daily.    [provider]  VITAMIN K PO Take 2,000 mcg by mouth daily.    [provider]    Allergies    Penicillins  Review of Systems   Review of Systems  Constitutional: Negative for appetite change and fatigue.  HENT: Negative for congestion, ear discharge and sinus pressure.   Eyes: Negative for discharge.  Respiratory: Negative for cough.   Cardiovascular: Negative for chest pain.  Gastrointestinal: Positive for abdominal pain. Negative for diarrhea.  Genitourinary: Negative for frequency and hematuria.  Musculoskeletal: Negative for back pain.  Skin: Negative for rash.  Neurological: Negative for seizures and headaches.  Psychiatric/Behavioral: Negative for hallucinations.    Physical Exam Updated Vital Signs BP (!) 156/70 (BP Location: Right Arm)   Pulse 87   Temp 99.9 F (37.7 C) (Oral)   Resp 18   Ht 5\' 7"  (1.702 m)   Wt 83.5 kg   SpO2 98%   BMI 28.82 kg/m   Physical Exam Vitals reviewed.  Constitutional:      Appearance: He is well-developed.  HENT:     Head: Normocephalic.     Nose: Nose normal.  Eyes:     General: No scleral icterus.    Conjunctiva/sclera: Conjunctivae normal.  Neck:     Thyroid: No thyromegaly.  Cardiovascular:     Rate and Rhythm: Normal  rate and regular rhythm.     Heart sounds: No murmur. No friction rub. No gallop.   Pulmonary:     Breath sounds: No stridor. No wheezing or rales.  Chest:     Chest wall: No tenderness.  Abdominal:     General: There is no distension.     Tenderness: There is abdominal tenderness. There is no rebound.     Comments: Tender llq  Musculoskeletal:        General: Normal range of motion.     Cervical back: Neck supple.  Lymphadenopathy:  Cervical: No cervical adenopathy.  Skin:    Findings: No erythema or rash.  Neurological:     Mental Status: He is oriented to person, place, and time.     Motor: No abnormal muscle tone.     Coordination: Coordination normal.  Psychiatric:        Behavior: Behavior normal.     ED Results / Procedures / Treatments   Labs (all labs ordered are listed, but only abnormal results are displayed) Labs Reviewed  BASIC METABOLIC PANEL - Abnormal; Notable for the following components:      Result Value   Glucose, Bld 135 (*)    All other components within normal limits  HEPATIC FUNCTION PANEL - Abnormal; Notable for the following components:   ALT 45 (*)    All other components within normal limits  CBC  LIPASE, BLOOD    EKG None  Radiology CT ABDOMEN PELVIS W CONTRAST  Result Date: 05/04/2019 CLINICAL DATA:  Abdominal pain to the left side. EXAM: CT ABDOMEN AND PELVIS WITH CONTRAST TECHNIQUE: Multidetector CT imaging of the abdomen and pelvis was performed using the standard protocol following bolus administration of intravenous contrast. CONTRAST:  116mL OMNIPAQUE IOHEXOL 300 MG/ML  SOLN COMPARISON:  None. FINDINGS: Lower chest: The lung bases are clear. The heart is enlarged. Hepatobiliary: There is decreased hepatic attenuation suggestive of hepatic steatosis. There is a hyperattenuating nodule measuring approximately 9 mm only visualized on the delayed phase within the right hepatic lobe (axial series 7, image 24). Cholelithiasis without  acute inflammation.There is no biliary ductal dilation. Pancreas: Normal contours without ductal dilatation. No peripancreatic fluid collection. Spleen: No splenic laceration or hematoma. Adrenals/Urinary Tract: --Adrenal glands: No adrenal hemorrhage. --Right kidney/ureter: No hydronephrosis or perinephric hematoma. --Left kidney/ureter: No hydronephrosis or perinephric hematoma. --Urinary bladder: Unremarkable. Stomach/Bowel: --Stomach/Duodenum: No hiatal hernia or other gastric abnormality. Normal duodenal course and caliber. --Small bowel: No dilatation or inflammation. --Colon: Findings are consistent with acute uncomplicated sigmoid diverticulitis. There is no free air. No adjacent abscess. There are scattered colonic diverticula throughout the remaining portions of the colon. --Appendix: Normal. Vascular/Lymphatic: Atherosclerotic calcification is present within the non-aneurysmal abdominal aorta, without hemodynamically significant stenosis. --No retroperitoneal lymphadenopathy. --No mesenteric lymphadenopathy. --No pelvic or inguinal lymphadenopathy. Reproductive: Prostate gland is significantly enlarged. Other: No ascites or free air. The abdominal wall is normal. Musculoskeletal. No acute displaced fractures. IMPRESSION: 1. Acute uncomplicated sigmoid diverticulitis. As an underlying mass cannot be excluded, follow-up colonoscopy is recommended following treatment of the patient's acute diverticulitis. 2. Hepatic steatosis with a hyperattenuating nodule measuring approximately 9 mm in the right hepatic lobe. This is favored to represent a benign hepatic hemangioma in the absence of any known malignancy. 3. Cholelithiasis without acute cholecystitis. 4. Prostatomegaly. 5. Cardiomegaly. 6. Aortic Atherosclerosis (ICD10-I70.0). Electronically Signed   By: Constance Holster M.D.   On: 05/04/2019 21:24    Procedures Procedures (including critical care time)  Medications Ordered in ED Medications    ciprofloxacin (CIPRO) tablet 500 mg (has no administration in time range)  metroNIDAZOLE (FLAGYL) tablet 500 mg (has no administration in time range)  HYDROmorphone (DILAUDID) injection 0.5 mg (0.5 mg Intravenous Given 05/04/19 1950)  ondansetron (ZOFRAN) injection 4 mg (4 mg Intravenous Given 05/04/19 1951)  iohexol (OMNIPAQUE) 300 MG/ML solution 100 mL (100 mLs Intravenous Contrast Given 05/04/19 2058)    ED Course  I have reviewed the triage vital signs and the nursing notes.  Pertinent labs & imaging results that were available during my care of  the patient were reviewed by me and considered in my medical decision making (see chart for details).    MDM Rules/Calculators/A&P                      Diverticulitis.  tx with cipro and flagyl.  Pt to follow up with gi Final Clinical Impression(s) / ED Diagnoses Final diagnoses:  Diverticulitis    Rx / DC Orders ED Discharge Orders         Ordered    metroNIDAZOLE (FLAGYL) 500 MG tablet  4 times daily     05/04/19 2234    ciprofloxacin (CIPRO) 500 MG tablet  2 times daily     05/04/19 2234    ondansetron (ZOFRAN ODT) 4 MG disintegrating tablet     05/04/19 2234    HYDROcodone-acetaminophen (NORCO/VICODIN) 5-325 MG tablet  Every 6 hours PRN     05/04/19 2234           Milton Ferguson, MD 05/04/19 2238

## 2019-05-04 NOTE — Discharge Instructions (Addendum)
Follow-up with Dr. Oneida Alar or one of her partners this week.  Return sooner if any problem

## 2019-05-22 DIAGNOSIS — R7989 Other specified abnormal findings of blood chemistry: Secondary | ICD-10-CM | POA: Diagnosis not present

## 2019-05-22 DIAGNOSIS — E782 Mixed hyperlipidemia: Secondary | ICD-10-CM | POA: Diagnosis not present

## 2019-05-22 DIAGNOSIS — R739 Hyperglycemia, unspecified: Secondary | ICD-10-CM | POA: Diagnosis not present

## 2019-05-22 DIAGNOSIS — E559 Vitamin D deficiency, unspecified: Secondary | ICD-10-CM | POA: Diagnosis not present

## 2019-05-22 DIAGNOSIS — R7301 Impaired fasting glucose: Secondary | ICD-10-CM | POA: Diagnosis not present

## 2019-05-27 DIAGNOSIS — E785 Hyperlipidemia, unspecified: Secondary | ICD-10-CM | POA: Diagnosis not present

## 2019-05-27 DIAGNOSIS — I2510 Atherosclerotic heart disease of native coronary artery without angina pectoris: Secondary | ICD-10-CM | POA: Diagnosis not present

## 2019-06-09 DIAGNOSIS — M545 Low back pain: Secondary | ICD-10-CM | POA: Diagnosis not present

## 2019-06-20 DIAGNOSIS — E782 Mixed hyperlipidemia: Secondary | ICD-10-CM | POA: Diagnosis not present

## 2019-06-20 DIAGNOSIS — E559 Vitamin D deficiency, unspecified: Secondary | ICD-10-CM | POA: Diagnosis not present

## 2019-06-20 DIAGNOSIS — I25810 Atherosclerosis of coronary artery bypass graft(s) without angina pectoris: Secondary | ICD-10-CM | POA: Diagnosis not present

## 2019-06-20 DIAGNOSIS — Z23 Encounter for immunization: Secondary | ICD-10-CM | POA: Diagnosis not present

## 2019-06-20 DIAGNOSIS — Z0001 Encounter for general adult medical examination with abnormal findings: Secondary | ICD-10-CM | POA: Diagnosis not present

## 2019-06-20 DIAGNOSIS — I2510 Atherosclerotic heart disease of native coronary artery without angina pectoris: Secondary | ICD-10-CM | POA: Diagnosis not present

## 2019-09-21 IMAGING — DX CHEST - 2 VIEW
2 series · 2 of 2 positions shown · non-contrast
Comparison: None

CLINICAL DATA: Fell off the back of his truck while unloading a
lawnmower, chest rock ramp, chest abrasions post fall

EXAM:
CHEST - 2 VIEW

[chest pa]
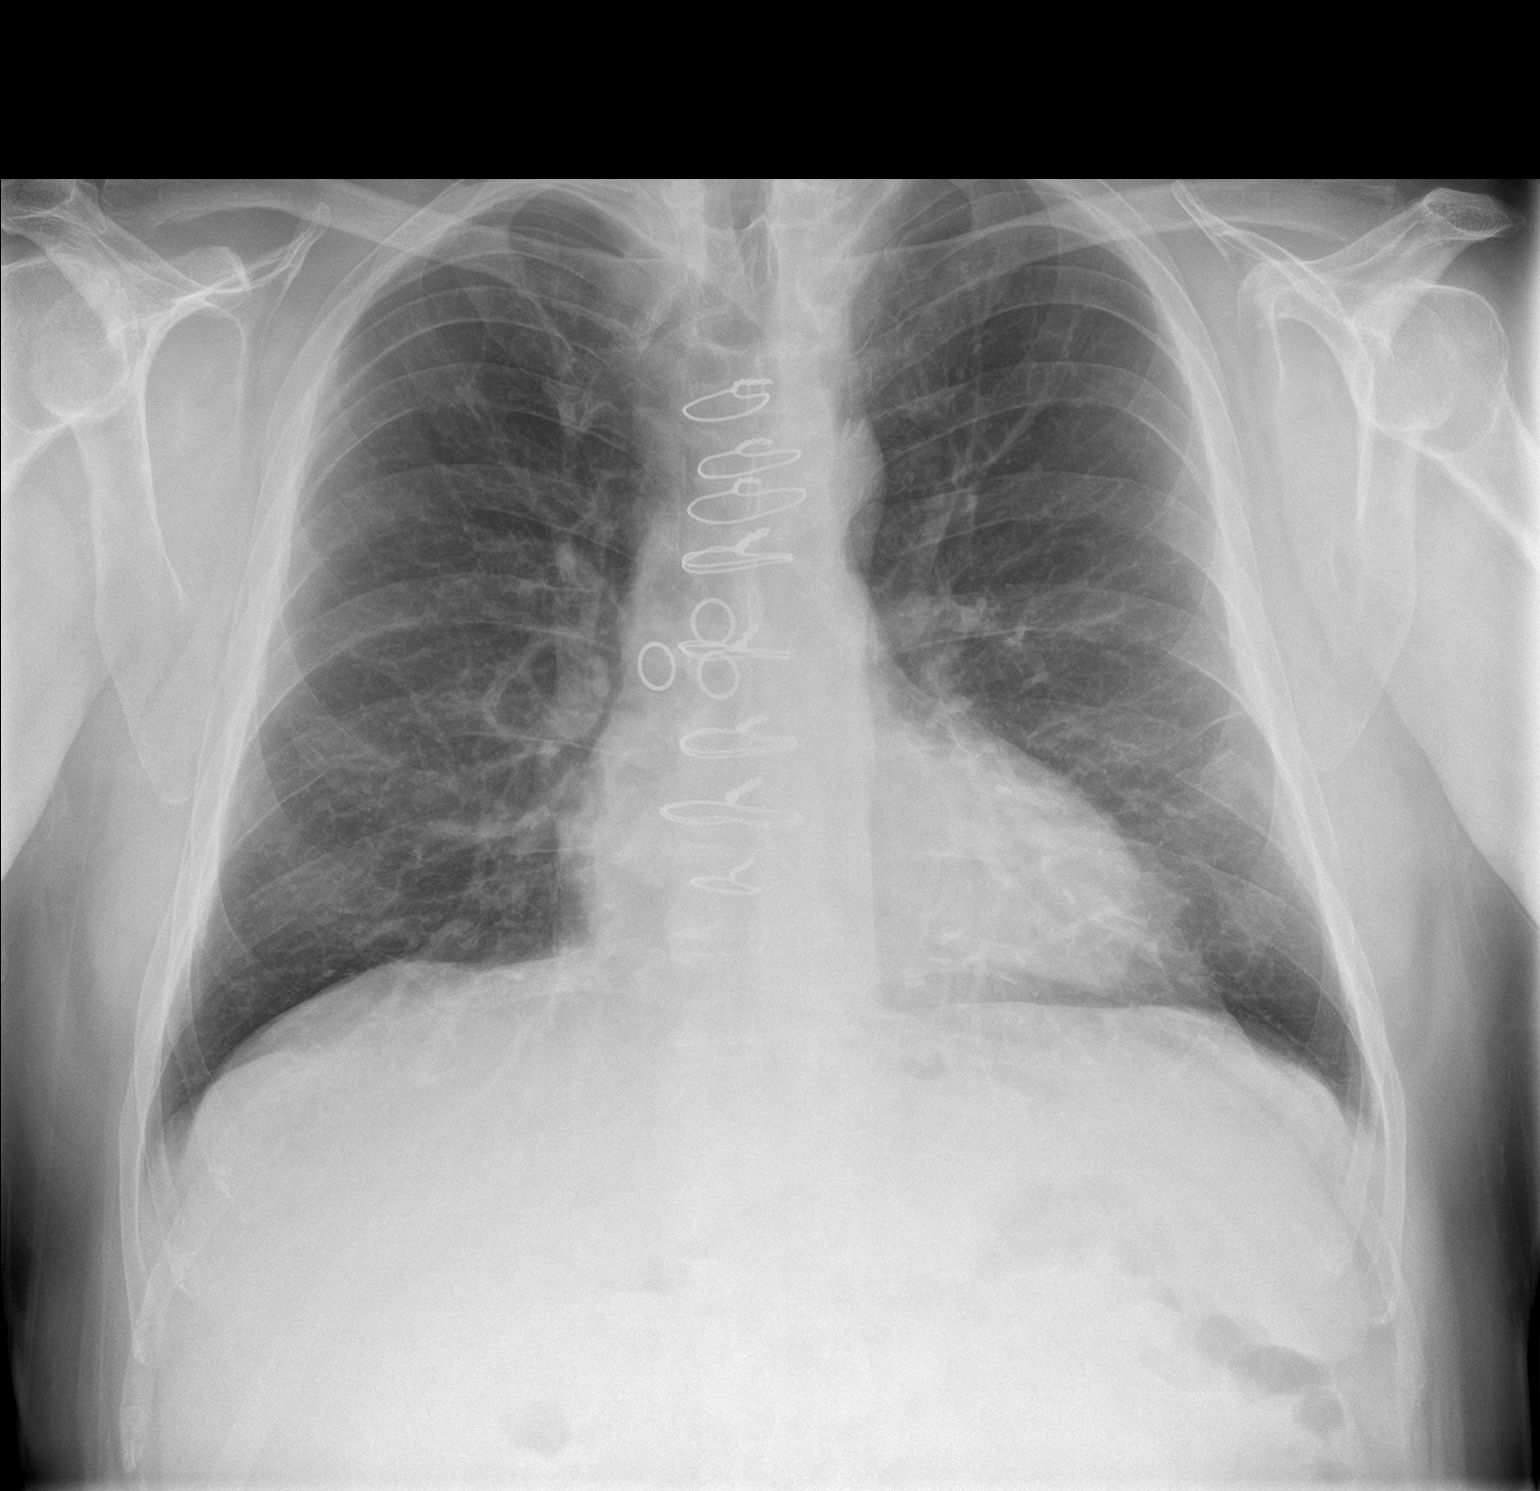

[chest lat]
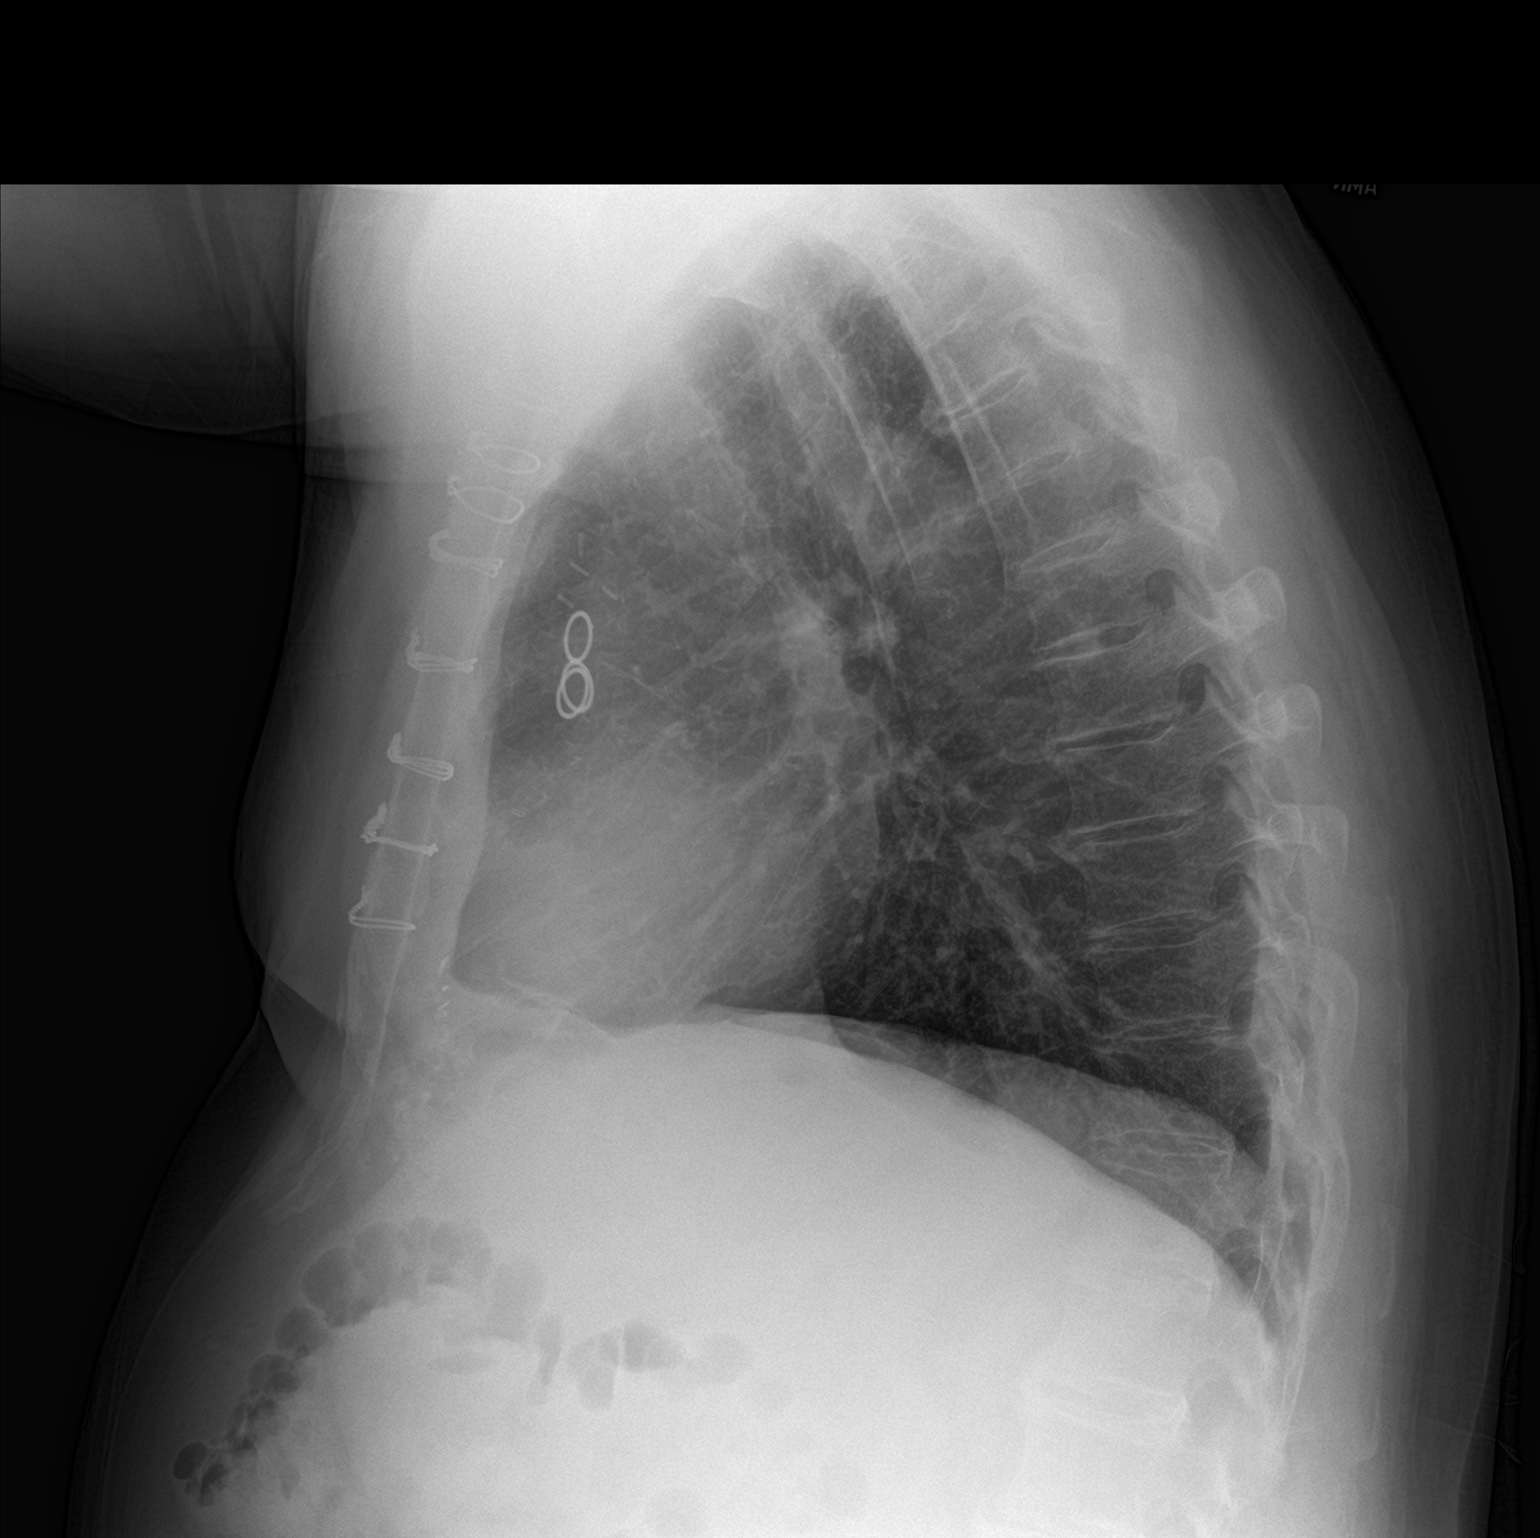

[2 of 2 positions shown; findings below may reference images not displayed]

FINDINGS: Upper normal heart size post CABG.

Mediastinal contours and pulmonary vascularity normal.

Lungs clear.

No pulmonary infiltrate, pleural effusion or pneumothorax.

No acute osseous findings.
IMPRESSION: Post CABG.

No acute abnormalities.

## 2019-09-23 DIAGNOSIS — E559 Vitamin D deficiency, unspecified: Secondary | ICD-10-CM | POA: Diagnosis not present

## 2019-09-23 DIAGNOSIS — R739 Hyperglycemia, unspecified: Secondary | ICD-10-CM | POA: Diagnosis not present

## 2019-09-23 DIAGNOSIS — R7989 Other specified abnormal findings of blood chemistry: Secondary | ICD-10-CM | POA: Diagnosis not present

## 2019-09-23 DIAGNOSIS — E782 Mixed hyperlipidemia: Secondary | ICD-10-CM | POA: Diagnosis not present

## 2019-09-29 DIAGNOSIS — E1159 Type 2 diabetes mellitus with other circulatory complications: Secondary | ICD-10-CM | POA: Diagnosis not present

## 2019-09-29 DIAGNOSIS — R7301 Impaired fasting glucose: Secondary | ICD-10-CM | POA: Diagnosis not present

## 2019-09-29 DIAGNOSIS — R7989 Other specified abnormal findings of blood chemistry: Secondary | ICD-10-CM | POA: Diagnosis not present

## 2019-09-29 DIAGNOSIS — Z0001 Encounter for general adult medical examination with abnormal findings: Secondary | ICD-10-CM | POA: Diagnosis not present

## 2019-09-29 DIAGNOSIS — I25810 Atherosclerosis of coronary artery bypass graft(s) without angina pectoris: Secondary | ICD-10-CM | POA: Diagnosis not present

## 2019-09-29 DIAGNOSIS — E782 Mixed hyperlipidemia: Secondary | ICD-10-CM | POA: Diagnosis not present

## 2019-09-29 DIAGNOSIS — Z23 Encounter for immunization: Secondary | ICD-10-CM | POA: Diagnosis not present

## 2020-01-27 DIAGNOSIS — E559 Vitamin D deficiency, unspecified: Secondary | ICD-10-CM | POA: Diagnosis not present

## 2020-01-27 DIAGNOSIS — E1369 Other specified diabetes mellitus with other specified complication: Secondary | ICD-10-CM | POA: Diagnosis not present

## 2020-01-27 DIAGNOSIS — E0859 Diabetes mellitus due to underlying condition with other circulatory complications: Secondary | ICD-10-CM | POA: Diagnosis not present

## 2020-01-27 DIAGNOSIS — E782 Mixed hyperlipidemia: Secondary | ICD-10-CM | POA: Diagnosis not present

## 2020-02-02 DIAGNOSIS — I2510 Atherosclerotic heart disease of native coronary artery without angina pectoris: Secondary | ICD-10-CM | POA: Diagnosis not present

## 2020-02-02 DIAGNOSIS — T466X5A Adverse effect of antihyperlipidemic and antiarteriosclerotic drugs, initial encounter: Secondary | ICD-10-CM | POA: Diagnosis not present

## 2020-02-02 DIAGNOSIS — Z1389 Encounter for screening for other disorder: Secondary | ICD-10-CM | POA: Diagnosis not present

## 2020-02-02 DIAGNOSIS — I25810 Atherosclerosis of coronary artery bypass graft(s) without angina pectoris: Secondary | ICD-10-CM | POA: Diagnosis not present

## 2020-02-02 DIAGNOSIS — E782 Mixed hyperlipidemia: Secondary | ICD-10-CM | POA: Diagnosis not present

## 2020-05-25 DIAGNOSIS — E785 Hyperlipidemia, unspecified: Secondary | ICD-10-CM | POA: Diagnosis not present

## 2020-05-25 DIAGNOSIS — I2510 Atherosclerotic heart disease of native coronary artery without angina pectoris: Secondary | ICD-10-CM | POA: Diagnosis not present

## 2020-06-22 DIAGNOSIS — C44209 Unspecified malignant neoplasm of skin of left ear and external auricular canal: Secondary | ICD-10-CM | POA: Diagnosis not present

## 2020-06-28 DIAGNOSIS — Z85828 Personal history of other malignant neoplasm of skin: Secondary | ICD-10-CM | POA: Diagnosis not present

## 2020-06-28 DIAGNOSIS — C44211 Basal cell carcinoma of skin of unspecified ear and external auricular canal: Secondary | ICD-10-CM | POA: Diagnosis not present

## 2020-06-28 DIAGNOSIS — D485 Neoplasm of uncertain behavior of skin: Secondary | ICD-10-CM | POA: Diagnosis not present

## 2020-07-06 IMAGING — CT CT ABD-PELV W/ CM
2 of 5 series · 15 of 46 positions shown, 17 images · IV contrast (Omnipaque or Isovue)
Comparison: None.

CLINICAL DATA: Abdominal pain to the left side.

EXAM:
CT ABDOMEN AND PELVIS WITH CONTRAST
TECHNIQUE: Multidetector CT imaging of the abdomen and pelvis was performed
using the standard protocol following bolus administration of
intravenous contrast.
CONTRAST:  100mL OMNIPAQUE IOHEXOL 300 MG/ML  SOLN

[Series 2: axial st · axial · 0.77mm/px · z∈[-340,+75]mm · 12 of 97 slices shown, 14 images]
[im 7/97  soft-tissue]
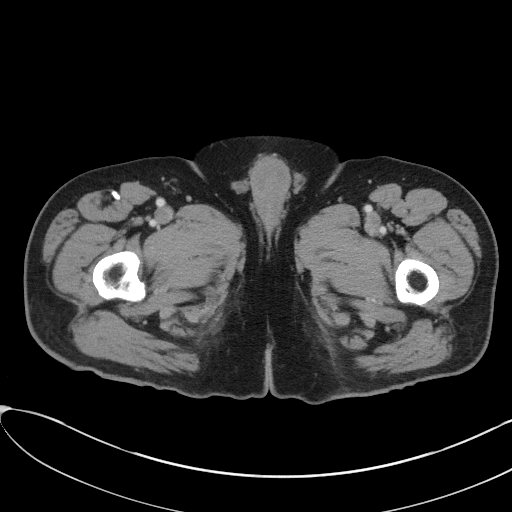
[im 7/97  bone]
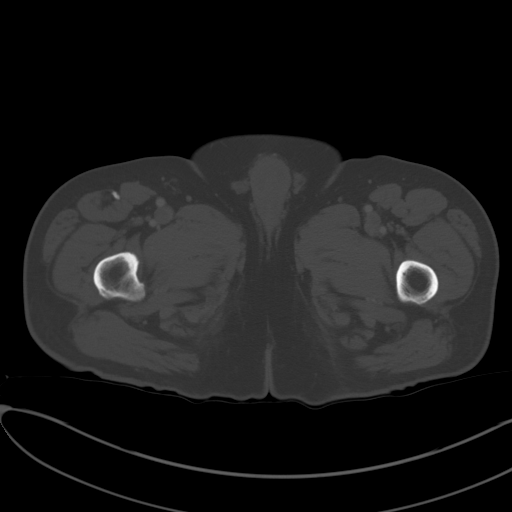
[im 14/97  soft-tissue]
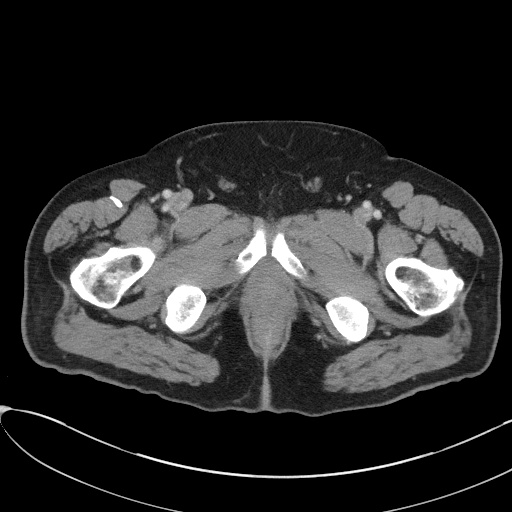
[im 21/97  soft-tissue]
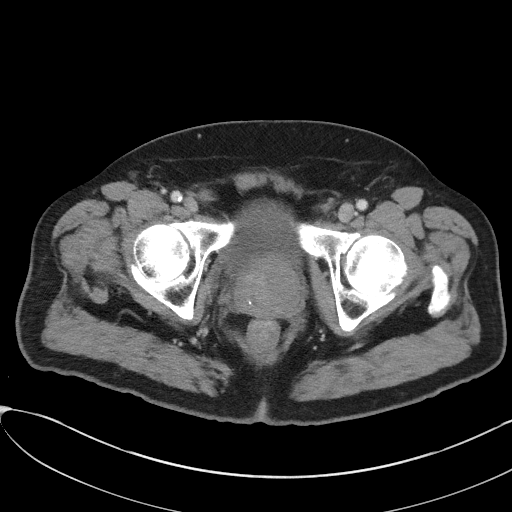
[im 28/97  soft-tissue]
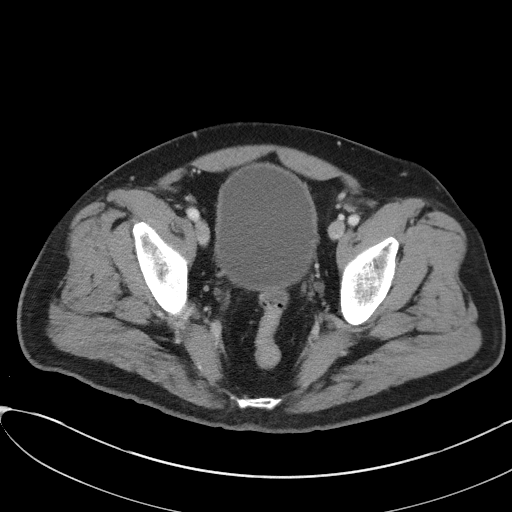
[im 35/97  soft-tissue]
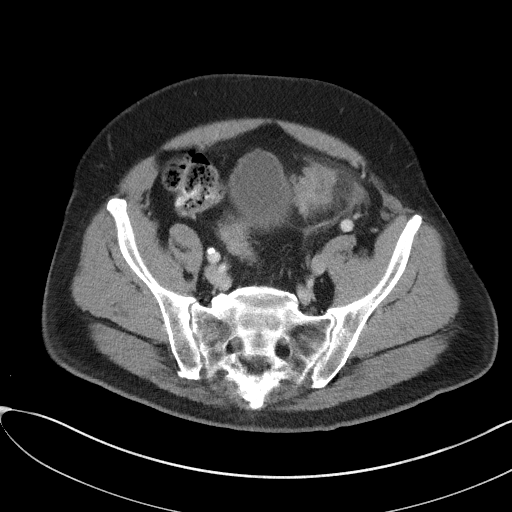
[im 42/97  soft-tissue]
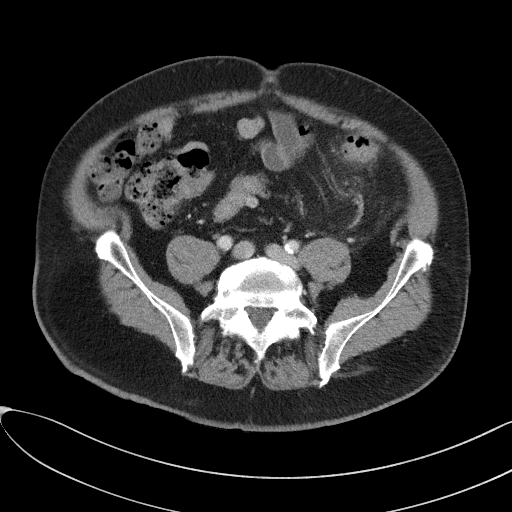
[im 55/97  soft-tissue]
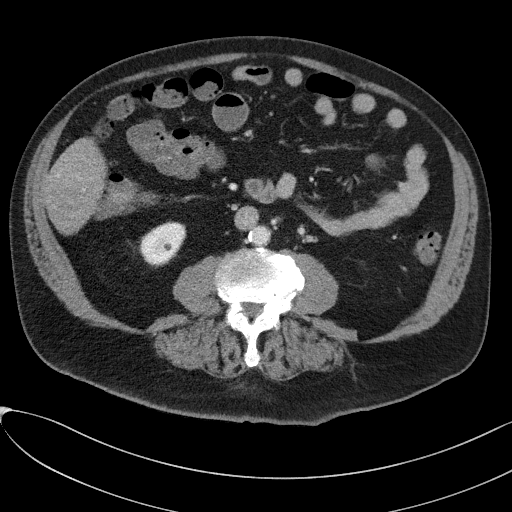
[im 62/97  soft-tissue]
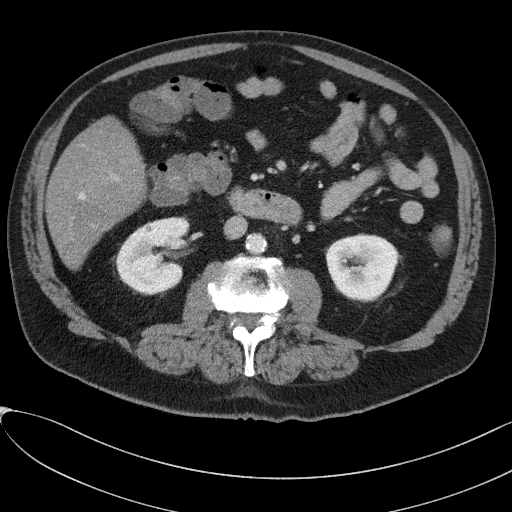
[im 69/97  soft-tissue]
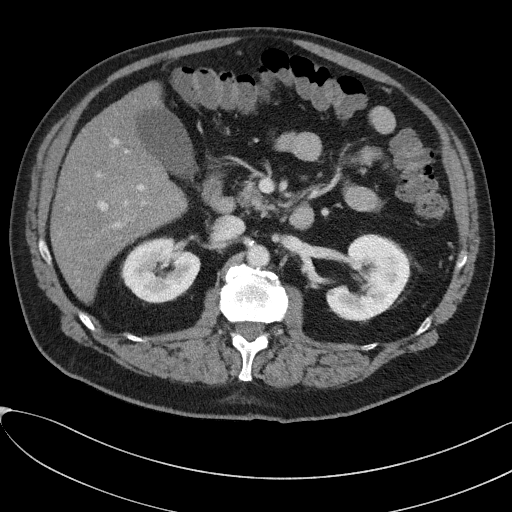
[im 69/97  bone]
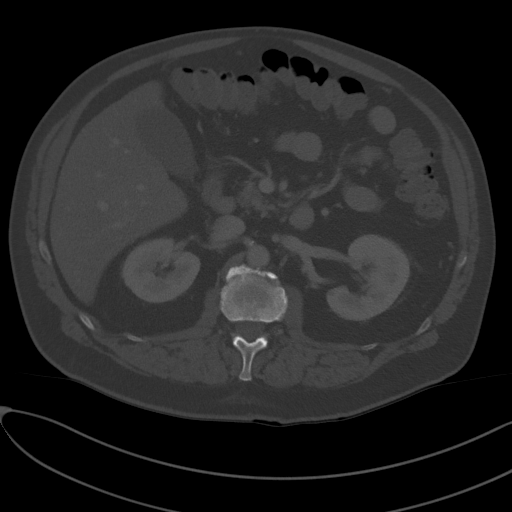
[im 76/97  soft-tissue]
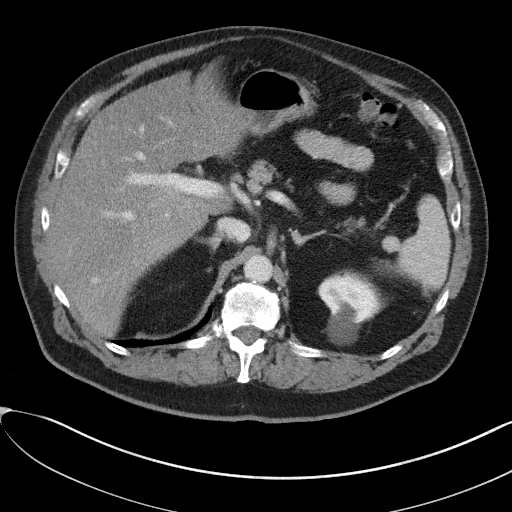
[im 83/97  soft-tissue]
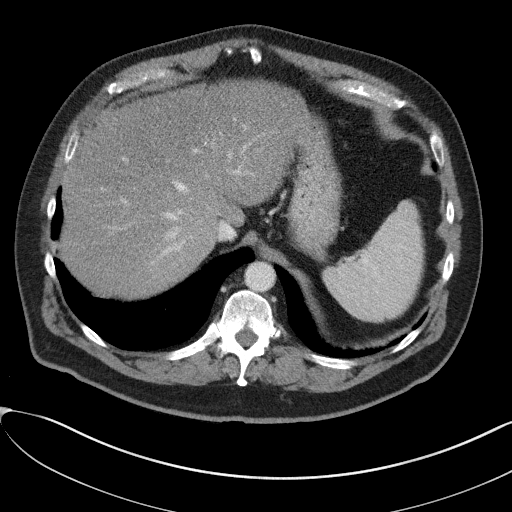
[im 90/97  soft-tissue]
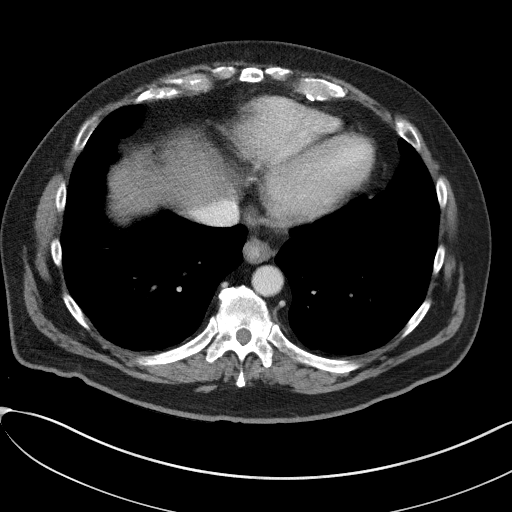

[Series 5: coronal st · coronal · 0.78mm/px · 3 of 124 slices shown]
[im 42/124  soft-tissue]
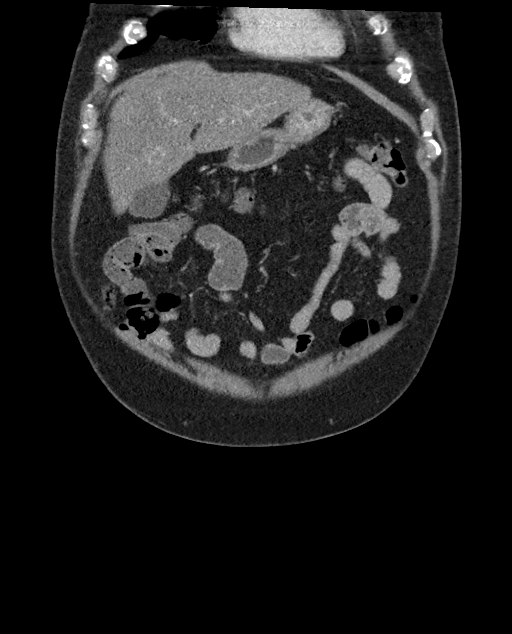
[im 55/124  soft-tissue]
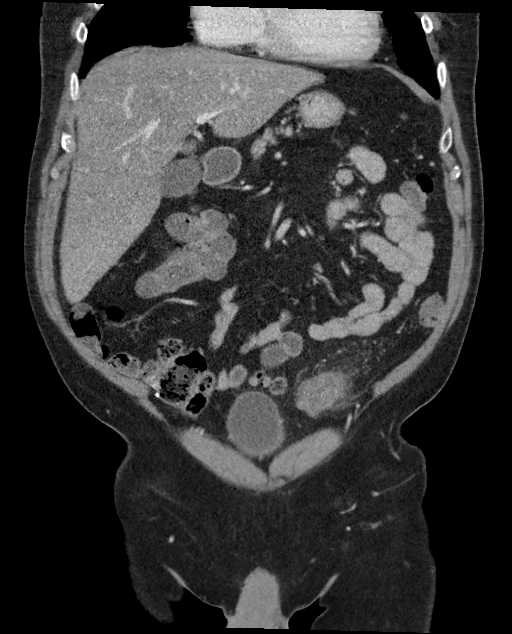
[im 69/124  soft-tissue]
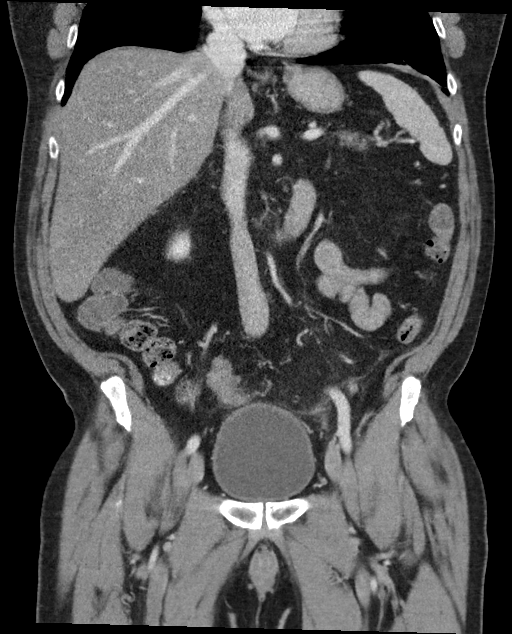

[15 of 46 positions shown; findings below may reference images not displayed]

FINDINGS: Lower chest: The lung bases are clear. The heart is enlarged.

Hepatobiliary: There is decreased hepatic attenuation suggestive of
hepatic steatosis. There is a hyperattenuating nodule measuring
approximately 9 mm only visualized on the delayed phase within the
right hepatic lobe (axial series 7, image 24). Cholelithiasis
without acute inflammation.There is no biliary ductal dilation.

Pancreas: Normal contours without ductal dilatation. No
peripancreatic fluid collection.

Spleen: No splenic laceration or hematoma.

Adrenals/Urinary Tract:

--Adrenal glands: No adrenal hemorrhage.

--Right kidney/ureter: No hydronephrosis or perinephric hematoma.

--Left kidney/ureter: No hydronephrosis or perinephric hematoma.

--Urinary bladder: Unremarkable.

Stomach/Bowel:

--Stomach/Duodenum: No hiatal hernia or other gastric abnormality.
Normal duodenal course and caliber.

--Small bowel: No dilatation or inflammation.

--Colon: Findings are consistent with acute uncomplicated sigmoid
diverticulitis. There is no free air. No adjacent abscess. There are
scattered colonic diverticula throughout the remaining portions of
the colon.

--Appendix: Normal.

Vascular/Lymphatic: Atherosclerotic calcification is present within
the non-aneurysmal abdominal aorta, without hemodynamically
significant stenosis.

--No retroperitoneal lymphadenopathy.

--No mesenteric lymphadenopathy.

--No pelvic or inguinal lymphadenopathy.

Reproductive: Prostate gland is significantly enlarged.

Other: No ascites or free air. The abdominal wall is normal.

Musculoskeletal. No acute displaced fractures.
IMPRESSION: 1. Acute uncomplicated sigmoid diverticulitis. As an underlying mass
cannot be excluded, follow-up colonoscopy is recommended following
treatment of the patient's acute diverticulitis.
2. Hepatic steatosis with a hyperattenuating nodule measuring
approximately 9 mm in the right hepatic lobe. This is favored to
represent a benign hepatic hemangioma in the absence of any known
malignancy.
3. Cholelithiasis without acute cholecystitis.
4. Prostatomegaly.
5. Cardiomegaly.
6. Aortic Atherosclerosis ([id number]).

## 2020-07-15 DIAGNOSIS — C44219 Basal cell carcinoma of skin of left ear and external auricular canal: Secondary | ICD-10-CM | POA: Diagnosis not present

## 2020-07-29 DIAGNOSIS — R7301 Impaired fasting glucose: Secondary | ICD-10-CM | POA: Diagnosis not present

## 2020-07-29 DIAGNOSIS — R7989 Other specified abnormal findings of blood chemistry: Secondary | ICD-10-CM | POA: Diagnosis not present

## 2020-07-29 DIAGNOSIS — E782 Mixed hyperlipidemia: Secondary | ICD-10-CM | POA: Diagnosis not present

## 2020-07-29 DIAGNOSIS — E559 Vitamin D deficiency, unspecified: Secondary | ICD-10-CM | POA: Diagnosis not present

## 2020-07-29 DIAGNOSIS — E7849 Other hyperlipidemia: Secondary | ICD-10-CM | POA: Diagnosis not present

## 2020-08-02 DIAGNOSIS — R7303 Prediabetes: Secondary | ICD-10-CM | POA: Diagnosis not present

## 2020-08-02 DIAGNOSIS — T466X5A Adverse effect of antihyperlipidemic and antiarteriosclerotic drugs, initial encounter: Secondary | ICD-10-CM | POA: Diagnosis not present

## 2020-08-02 DIAGNOSIS — I25810 Atherosclerosis of coronary artery bypass graft(s) without angina pectoris: Secondary | ICD-10-CM | POA: Diagnosis not present

## 2020-08-02 DIAGNOSIS — E1159 Type 2 diabetes mellitus with other circulatory complications: Secondary | ICD-10-CM | POA: Diagnosis not present

## 2020-08-02 DIAGNOSIS — E7849 Other hyperlipidemia: Secondary | ICD-10-CM | POA: Diagnosis not present

## 2020-08-02 DIAGNOSIS — I2510 Atherosclerotic heart disease of native coronary artery without angina pectoris: Secondary | ICD-10-CM | POA: Diagnosis not present

## 2020-08-02 DIAGNOSIS — G720 Drug-induced myopathy: Secondary | ICD-10-CM | POA: Diagnosis not present

## 2020-08-02 DIAGNOSIS — R7989 Other specified abnormal findings of blood chemistry: Secondary | ICD-10-CM | POA: Diagnosis not present

## 2020-12-13 DIAGNOSIS — E1159 Type 2 diabetes mellitus with other circulatory complications: Secondary | ICD-10-CM | POA: Diagnosis not present

## 2020-12-13 DIAGNOSIS — E782 Mixed hyperlipidemia: Secondary | ICD-10-CM | POA: Diagnosis not present

## 2021-01-11 DIAGNOSIS — L570 Actinic keratosis: Secondary | ICD-10-CM | POA: Diagnosis not present

## 2021-01-11 DIAGNOSIS — C44612 Basal cell carcinoma of skin of right upper limb, including shoulder: Secondary | ICD-10-CM | POA: Diagnosis not present

## 2021-01-11 DIAGNOSIS — C44619 Basal cell carcinoma of skin of left upper limb, including shoulder: Secondary | ICD-10-CM | POA: Diagnosis not present

## 2021-01-28 DIAGNOSIS — E7849 Other hyperlipidemia: Secondary | ICD-10-CM | POA: Diagnosis not present

## 2021-01-28 DIAGNOSIS — R739 Hyperglycemia, unspecified: Secondary | ICD-10-CM | POA: Diagnosis not present

## 2021-01-28 DIAGNOSIS — E559 Vitamin D deficiency, unspecified: Secondary | ICD-10-CM | POA: Diagnosis not present

## 2021-01-28 DIAGNOSIS — E782 Mixed hyperlipidemia: Secondary | ICD-10-CM | POA: Diagnosis not present

## 2021-02-01 DIAGNOSIS — R7303 Prediabetes: Secondary | ICD-10-CM | POA: Diagnosis not present

## 2021-02-01 DIAGNOSIS — E559 Vitamin D deficiency, unspecified: Secondary | ICD-10-CM | POA: Diagnosis not present

## 2021-02-01 DIAGNOSIS — I2510 Atherosclerotic heart disease of native coronary artery without angina pectoris: Secondary | ICD-10-CM | POA: Diagnosis not present

## 2021-02-01 DIAGNOSIS — G720 Drug-induced myopathy: Secondary | ICD-10-CM | POA: Diagnosis not present

## 2021-02-01 DIAGNOSIS — R7989 Other specified abnormal findings of blood chemistry: Secondary | ICD-10-CM | POA: Diagnosis not present

## 2021-02-01 DIAGNOSIS — E7849 Other hyperlipidemia: Secondary | ICD-10-CM | POA: Diagnosis not present

## 2021-02-01 DIAGNOSIS — E1159 Type 2 diabetes mellitus with other circulatory complications: Secondary | ICD-10-CM | POA: Diagnosis not present

## 2021-02-01 DIAGNOSIS — Z0001 Encounter for general adult medical examination with abnormal findings: Secondary | ICD-10-CM | POA: Diagnosis not present

## 2021-02-17 DIAGNOSIS — C44612 Basal cell carcinoma of skin of right upper limb, including shoulder: Secondary | ICD-10-CM | POA: Diagnosis not present

## 2021-03-13 DIAGNOSIS — E782 Mixed hyperlipidemia: Secondary | ICD-10-CM | POA: Diagnosis not present

## 2021-03-13 DIAGNOSIS — E1159 Type 2 diabetes mellitus with other circulatory complications: Secondary | ICD-10-CM | POA: Diagnosis not present

## 2021-04-05 DIAGNOSIS — H6123 Impacted cerumen, bilateral: Secondary | ICD-10-CM | POA: Diagnosis not present

## 2021-04-05 DIAGNOSIS — B369 Superficial mycosis, unspecified: Secondary | ICD-10-CM | POA: Diagnosis not present

## 2021-05-31 DIAGNOSIS — I2510 Atherosclerotic heart disease of native coronary artery without angina pectoris: Secondary | ICD-10-CM | POA: Diagnosis not present

## 2021-05-31 DIAGNOSIS — E785 Hyperlipidemia, unspecified: Secondary | ICD-10-CM | POA: Diagnosis not present

## 2021-06-07 DIAGNOSIS — J019 Acute sinusitis, unspecified: Secondary | ICD-10-CM | POA: Diagnosis not present

## 2021-06-07 DIAGNOSIS — J209 Acute bronchitis, unspecified: Secondary | ICD-10-CM | POA: Diagnosis not present

## 2021-06-07 DIAGNOSIS — R059 Cough, unspecified: Secondary | ICD-10-CM | POA: Diagnosis not present

## 2021-06-27 DIAGNOSIS — Z85828 Personal history of other malignant neoplasm of skin: Secondary | ICD-10-CM | POA: Diagnosis not present

## 2021-06-27 DIAGNOSIS — L570 Actinic keratosis: Secondary | ICD-10-CM | POA: Diagnosis not present

## 2021-08-01 DIAGNOSIS — E782 Mixed hyperlipidemia: Secondary | ICD-10-CM | POA: Diagnosis not present

## 2021-08-01 DIAGNOSIS — R7303 Prediabetes: Secondary | ICD-10-CM | POA: Diagnosis not present

## 2021-08-01 DIAGNOSIS — E1369 Other specified diabetes mellitus with other specified complication: Secondary | ICD-10-CM | POA: Diagnosis not present

## 2021-08-01 DIAGNOSIS — E0859 Diabetes mellitus due to underlying condition with other circulatory complications: Secondary | ICD-10-CM | POA: Diagnosis not present

## 2021-08-01 DIAGNOSIS — E559 Vitamin D deficiency, unspecified: Secondary | ICD-10-CM | POA: Diagnosis not present

## 2021-08-01 DIAGNOSIS — E7849 Other hyperlipidemia: Secondary | ICD-10-CM | POA: Diagnosis not present

## 2021-08-04 DIAGNOSIS — R7301 Impaired fasting glucose: Secondary | ICD-10-CM | POA: Diagnosis not present

## 2021-08-04 DIAGNOSIS — G720 Drug-induced myopathy: Secondary | ICD-10-CM | POA: Diagnosis not present

## 2021-08-04 DIAGNOSIS — I2510 Atherosclerotic heart disease of native coronary artery without angina pectoris: Secondary | ICD-10-CM | POA: Diagnosis not present

## 2021-08-04 DIAGNOSIS — R7989 Other specified abnormal findings of blood chemistry: Secondary | ICD-10-CM | POA: Diagnosis not present

## 2021-08-04 DIAGNOSIS — E7849 Other hyperlipidemia: Secondary | ICD-10-CM | POA: Diagnosis not present

## 2021-08-04 DIAGNOSIS — E1159 Type 2 diabetes mellitus with other circulatory complications: Secondary | ICD-10-CM | POA: Diagnosis not present

## 2021-08-04 DIAGNOSIS — T466X5A Adverse effect of antihyperlipidemic and antiarteriosclerotic drugs, initial encounter: Secondary | ICD-10-CM | POA: Diagnosis not present

## 2021-08-04 DIAGNOSIS — I25810 Atherosclerosis of coronary artery bypass graft(s) without angina pectoris: Secondary | ICD-10-CM | POA: Diagnosis not present

## 2021-08-04 DIAGNOSIS — R7303 Prediabetes: Secondary | ICD-10-CM | POA: Diagnosis not present

## 2021-08-04 DIAGNOSIS — E559 Vitamin D deficiency, unspecified: Secondary | ICD-10-CM | POA: Diagnosis not present

## 2021-08-04 DIAGNOSIS — M545 Low back pain, unspecified: Secondary | ICD-10-CM | POA: Diagnosis not present

## 2021-09-12 DIAGNOSIS — R0789 Other chest pain: Secondary | ICD-10-CM | POA: Diagnosis not present

## 2021-09-12 DIAGNOSIS — I2510 Atherosclerotic heart disease of native coronary artery without angina pectoris: Secondary | ICD-10-CM | POA: Diagnosis not present

## 2021-11-29 DIAGNOSIS — E559 Vitamin D deficiency, unspecified: Secondary | ICD-10-CM | POA: Diagnosis not present

## 2021-11-29 DIAGNOSIS — R7989 Other specified abnormal findings of blood chemistry: Secondary | ICD-10-CM | POA: Diagnosis not present

## 2021-11-29 DIAGNOSIS — E785 Hyperlipidemia, unspecified: Secondary | ICD-10-CM | POA: Diagnosis not present

## 2021-11-29 DIAGNOSIS — E1369 Other specified diabetes mellitus with other specified complication: Secondary | ICD-10-CM | POA: Diagnosis not present

## 2021-11-29 DIAGNOSIS — E7849 Other hyperlipidemia: Secondary | ICD-10-CM | POA: Diagnosis not present

## 2021-12-07 DIAGNOSIS — R7303 Prediabetes: Secondary | ICD-10-CM | POA: Diagnosis not present

## 2021-12-07 DIAGNOSIS — I25810 Atherosclerosis of coronary artery bypass graft(s) without angina pectoris: Secondary | ICD-10-CM | POA: Diagnosis not present

## 2021-12-07 DIAGNOSIS — E559 Vitamin D deficiency, unspecified: Secondary | ICD-10-CM | POA: Diagnosis not present

## 2021-12-07 DIAGNOSIS — E7849 Other hyperlipidemia: Secondary | ICD-10-CM | POA: Diagnosis not present

## 2021-12-07 DIAGNOSIS — I2510 Atherosclerotic heart disease of native coronary artery without angina pectoris: Secondary | ICD-10-CM | POA: Diagnosis not present

## 2021-12-07 DIAGNOSIS — E785 Hyperlipidemia, unspecified: Secondary | ICD-10-CM | POA: Diagnosis not present

## 2021-12-07 DIAGNOSIS — Z23 Encounter for immunization: Secondary | ICD-10-CM | POA: Diagnosis not present

## 2021-12-07 DIAGNOSIS — T466X5A Adverse effect of antihyperlipidemic and antiarteriosclerotic drugs, initial encounter: Secondary | ICD-10-CM | POA: Diagnosis not present

## 2021-12-07 DIAGNOSIS — G720 Drug-induced myopathy: Secondary | ICD-10-CM | POA: Diagnosis not present

## 2021-12-07 DIAGNOSIS — E1159 Type 2 diabetes mellitus with other circulatory complications: Secondary | ICD-10-CM | POA: Diagnosis not present

## 2021-12-07 DIAGNOSIS — R7989 Other specified abnormal findings of blood chemistry: Secondary | ICD-10-CM | POA: Diagnosis not present

## 2021-12-07 DIAGNOSIS — R7301 Impaired fasting glucose: Secondary | ICD-10-CM | POA: Diagnosis not present

## 2022-01-27 DIAGNOSIS — I10 Essential (primary) hypertension: Secondary | ICD-10-CM | POA: Diagnosis not present

## 2022-01-27 DIAGNOSIS — H43813 Vitreous degeneration, bilateral: Secondary | ICD-10-CM | POA: Diagnosis not present

## 2022-01-27 DIAGNOSIS — H2513 Age-related nuclear cataract, bilateral: Secondary | ICD-10-CM | POA: Diagnosis not present

## 2022-01-27 DIAGNOSIS — H35033 Hypertensive retinopathy, bilateral: Secondary | ICD-10-CM | POA: Diagnosis not present

## 2022-01-27 DIAGNOSIS — H524 Presbyopia: Secondary | ICD-10-CM | POA: Diagnosis not present

## 2022-03-01 DIAGNOSIS — Z85828 Personal history of other malignant neoplasm of skin: Secondary | ICD-10-CM | POA: Diagnosis not present

## 2022-03-01 DIAGNOSIS — C44612 Basal cell carcinoma of skin of right upper limb, including shoulder: Secondary | ICD-10-CM | POA: Diagnosis not present

## 2022-03-01 DIAGNOSIS — L570 Actinic keratosis: Secondary | ICD-10-CM | POA: Diagnosis not present

## 2022-03-01 DIAGNOSIS — C44619 Basal cell carcinoma of skin of left upper limb, including shoulder: Secondary | ICD-10-CM | POA: Diagnosis not present

## 2022-03-01 DIAGNOSIS — D485 Neoplasm of uncertain behavior of skin: Secondary | ICD-10-CM | POA: Diagnosis not present

## 2022-03-16 DIAGNOSIS — L905 Scar conditions and fibrosis of skin: Secondary | ICD-10-CM | POA: Diagnosis not present

## 2022-03-16 DIAGNOSIS — C44619 Basal cell carcinoma of skin of left upper limb, including shoulder: Secondary | ICD-10-CM | POA: Diagnosis not present

## 2022-03-30 DIAGNOSIS — C44622 Squamous cell carcinoma of skin of right upper limb, including shoulder: Secondary | ICD-10-CM | POA: Diagnosis not present

## 2022-03-30 DIAGNOSIS — C44612 Basal cell carcinoma of skin of right upper limb, including shoulder: Secondary | ICD-10-CM | POA: Diagnosis not present

## 2022-04-06 DIAGNOSIS — I10 Essential (primary) hypertension: Secondary | ICD-10-CM | POA: Diagnosis not present

## 2022-04-12 DIAGNOSIS — H6122 Impacted cerumen, left ear: Secondary | ICD-10-CM | POA: Diagnosis not present

## 2022-04-13 DIAGNOSIS — C44311 Basal cell carcinoma of skin of nose: Secondary | ICD-10-CM | POA: Diagnosis not present

## 2022-04-20 DIAGNOSIS — C44311 Basal cell carcinoma of skin of nose: Secondary | ICD-10-CM | POA: Diagnosis not present

## 2022-05-09 DIAGNOSIS — E785 Hyperlipidemia, unspecified: Secondary | ICD-10-CM | POA: Diagnosis not present

## 2022-05-09 DIAGNOSIS — E7849 Other hyperlipidemia: Secondary | ICD-10-CM | POA: Diagnosis not present

## 2022-05-09 DIAGNOSIS — E559 Vitamin D deficiency, unspecified: Secondary | ICD-10-CM | POA: Diagnosis not present

## 2022-05-09 DIAGNOSIS — E0859 Diabetes mellitus due to underlying condition with other circulatory complications: Secondary | ICD-10-CM | POA: Diagnosis not present

## 2022-05-09 DIAGNOSIS — I10 Essential (primary) hypertension: Secondary | ICD-10-CM | POA: Diagnosis not present

## 2022-05-15 DIAGNOSIS — E785 Hyperlipidemia, unspecified: Secondary | ICD-10-CM | POA: Diagnosis not present

## 2022-05-15 DIAGNOSIS — R7301 Impaired fasting glucose: Secondary | ICD-10-CM | POA: Diagnosis not present

## 2022-05-15 DIAGNOSIS — E1159 Type 2 diabetes mellitus with other circulatory complications: Secondary | ICD-10-CM | POA: Diagnosis not present

## 2022-05-15 DIAGNOSIS — R030 Elevated blood-pressure reading, without diagnosis of hypertension: Secondary | ICD-10-CM | POA: Diagnosis not present

## 2022-05-15 DIAGNOSIS — R7989 Other specified abnormal findings of blood chemistry: Secondary | ICD-10-CM | POA: Diagnosis not present

## 2022-05-15 DIAGNOSIS — E7849 Other hyperlipidemia: Secondary | ICD-10-CM | POA: Diagnosis not present

## 2022-05-15 DIAGNOSIS — G720 Drug-induced myopathy: Secondary | ICD-10-CM | POA: Diagnosis not present

## 2022-05-15 DIAGNOSIS — Z1389 Encounter for screening for other disorder: Secondary | ICD-10-CM | POA: Diagnosis not present

## 2022-05-15 DIAGNOSIS — I25810 Atherosclerosis of coronary artery bypass graft(s) without angina pectoris: Secondary | ICD-10-CM | POA: Diagnosis not present

## 2022-05-15 DIAGNOSIS — Z0001 Encounter for general adult medical examination with abnormal findings: Secondary | ICD-10-CM | POA: Diagnosis not present

## 2022-05-15 DIAGNOSIS — R739 Hyperglycemia, unspecified: Secondary | ICD-10-CM | POA: Diagnosis not present

## 2022-06-06 DIAGNOSIS — I2510 Atherosclerotic heart disease of native coronary artery without angina pectoris: Secondary | ICD-10-CM | POA: Diagnosis not present

## 2022-06-06 DIAGNOSIS — E785 Hyperlipidemia, unspecified: Secondary | ICD-10-CM | POA: Diagnosis not present

## 2022-08-09 DIAGNOSIS — L989 Disorder of the skin and subcutaneous tissue, unspecified: Secondary | ICD-10-CM | POA: Diagnosis not present

## 2022-08-09 DIAGNOSIS — L820 Inflamed seborrheic keratosis: Secondary | ICD-10-CM | POA: Diagnosis not present

## 2022-08-09 DIAGNOSIS — D485 Neoplasm of uncertain behavior of skin: Secondary | ICD-10-CM | POA: Diagnosis not present

## 2022-09-04 ENCOUNTER — Encounter (INDEPENDENT_AMBULATORY_CARE_PROVIDER_SITE_OTHER): Payer: Self-pay | Admitting: *Deleted

## 2022-09-14 DIAGNOSIS — R739 Hyperglycemia, unspecified: Secondary | ICD-10-CM | POA: Diagnosis not present

## 2022-09-14 DIAGNOSIS — E7849 Other hyperlipidemia: Secondary | ICD-10-CM | POA: Diagnosis not present

## 2022-09-14 DIAGNOSIS — I10 Essential (primary) hypertension: Secondary | ICD-10-CM | POA: Diagnosis not present

## 2022-09-14 DIAGNOSIS — E782 Mixed hyperlipidemia: Secondary | ICD-10-CM | POA: Diagnosis not present

## 2022-09-14 DIAGNOSIS — R7303 Prediabetes: Secondary | ICD-10-CM | POA: Diagnosis not present

## 2022-09-14 DIAGNOSIS — R7301 Impaired fasting glucose: Secondary | ICD-10-CM | POA: Diagnosis not present

## 2022-09-14 DIAGNOSIS — R5383 Other fatigue: Secondary | ICD-10-CM | POA: Diagnosis not present

## 2022-09-14 DIAGNOSIS — R7989 Other specified abnormal findings of blood chemistry: Secondary | ICD-10-CM | POA: Diagnosis not present

## 2022-09-21 DIAGNOSIS — M19041 Primary osteoarthritis, right hand: Secondary | ICD-10-CM | POA: Diagnosis not present

## 2022-09-21 DIAGNOSIS — Z8639 Personal history of other endocrine, nutritional and metabolic disease: Secondary | ICD-10-CM | POA: Diagnosis not present

## 2022-09-21 DIAGNOSIS — E1159 Type 2 diabetes mellitus with other circulatory complications: Secondary | ICD-10-CM | POA: Diagnosis not present

## 2022-09-21 DIAGNOSIS — R7301 Impaired fasting glucose: Secondary | ICD-10-CM | POA: Diagnosis not present

## 2022-09-21 DIAGNOSIS — I2510 Atherosclerotic heart disease of native coronary artery without angina pectoris: Secondary | ICD-10-CM | POA: Diagnosis not present

## 2022-09-21 DIAGNOSIS — T466X5A Adverse effect of antihyperlipidemic and antiarteriosclerotic drugs, initial encounter: Secondary | ICD-10-CM | POA: Diagnosis not present

## 2022-09-21 DIAGNOSIS — R739 Hyperglycemia, unspecified: Secondary | ICD-10-CM | POA: Diagnosis not present

## 2022-09-21 DIAGNOSIS — R7989 Other specified abnormal findings of blood chemistry: Secondary | ICD-10-CM | POA: Diagnosis not present

## 2022-09-21 DIAGNOSIS — E7849 Other hyperlipidemia: Secondary | ICD-10-CM | POA: Diagnosis not present

## 2022-09-21 DIAGNOSIS — R030 Elevated blood-pressure reading, without diagnosis of hypertension: Secondary | ICD-10-CM | POA: Diagnosis not present

## 2022-09-21 DIAGNOSIS — I25810 Atherosclerosis of coronary artery bypass graft(s) without angina pectoris: Secondary | ICD-10-CM | POA: Diagnosis not present

## 2022-09-21 DIAGNOSIS — G720 Drug-induced myopathy: Secondary | ICD-10-CM | POA: Diagnosis not present

## 2022-09-28 ENCOUNTER — Encounter (INDEPENDENT_AMBULATORY_CARE_PROVIDER_SITE_OTHER): Payer: Self-pay | Admitting: *Deleted

## 2022-10-03 ENCOUNTER — Telehealth (INDEPENDENT_AMBULATORY_CARE_PROVIDER_SITE_OTHER): Payer: Self-pay | Admitting: *Deleted

## 2022-10-03 NOTE — Telephone Encounter (Signed)
Referring MD/PCP: [employer]: [id number]  Best Phone Number: [phone] or [phone]  Reason for the colonoscopy hx polyps, fam hx colon ca - 5 yr TCS  Has patient had this procedure before?  Yes, 09/2017  If so, when, by whom and where?    Is there a family history of colon cancer?  Yes, brother  Who?  What age when diagnosed?    Is patient diabetic? If yes, Type 1 or Type 2   no      Does patient have prosthetic heart valve or mechanical valve?  no  Do you have a pacemaker/defibrillator?  no  Has patient ever had endocarditis/atrial fibrillation? no  Has patient had joint replacement within last 12 months?  no  Is patient constipated or do they take laxatives? no  Does patient have a history of alcohol/drug use?  no  Does patient use oxygen? no  Have you had a stroke/heart attack last 6 mths? no  Do you take medicine for weight loss?  no  For male patients,: have you had a hysterectomy                       are you post menopausal                       do you still have your menstrual cycle   Do you take any blood-thinning medications such as: (aspirin, warfarin, Plavix, Aggrenox)  yes  If yes we need the name, milligram, dosage and who is prescribing doctor Asa 81 mg daily  Medications: asa 81 mg daily, praluent 75 mg/ml injection once every 14 days, metoprolol 12.5 mg daily  Allergies: PCN  Pharmacy: [facility]

## 2022-10-04 NOTE — Telephone Encounter (Signed)
Ok to schedule.  Room 2  Thanks,  Vista Lawman, MD Gastroenterology and Hepatology [facility] Gastroenterology

## 2022-10-11 DIAGNOSIS — H6122 Impacted cerumen, left ear: Secondary | ICD-10-CM | POA: Diagnosis not present

## 2022-10-11 DIAGNOSIS — R0982 Postnasal drip: Secondary | ICD-10-CM | POA: Diagnosis not present

## 2022-10-19 NOTE — Telephone Encounter (Signed)
Will call once get Oct schedule

## 2022-11-02 ENCOUNTER — Encounter (INDEPENDENT_AMBULATORY_CARE_PROVIDER_SITE_OTHER): Payer: Self-pay | Admitting: *Deleted

## 2022-11-02 MED ORDER — PEG 3350-KCL-NA BICARB-NACL 420 G PO SOLR: 4000.0000 mL | Freq: Once | ORAL | 0 refills | Status: AC

## 2022-11-02 NOTE — Telephone Encounter (Signed)
Pt contacted and spoke with wife. Scheduled for 12/08/22 at 7:30. Instructions mailed to patient. Prep sent to pharmacy. No PA needed via insurance

## 2022-11-02 NOTE — Telephone Encounter (Signed)
Referral completed

## 2022-11-02 NOTE — Addendum Note (Signed)
Addended by: Marlowe Shores on: 11/02/2022 12:09 PM   Modules accepted: Orders

## 2022-12-07 NOTE — Anesthesia Preprocedure Evaluation (Addendum)
Anesthesia Evaluation  Patient identified by MRN, date of birth, ID band Patient awake    Reviewed: Allergy & Precautions, H&P , NPO status , Patient's Chart, lab work & pertinent test results, reviewed documented beta blocker date and time   Airway Mallampati: II  TM Distance: >3 FB Neck ROM: full    Dental no notable dental hx. (+) Dental Advisory Given, Teeth Intact   Pulmonary neg pulmonary ROS   Pulmonary exam normal breath sounds clear to auscultation       Cardiovascular + CAD  Normal cardiovascular exam Rhythm:regular Rate:Normal     Neuro/Psych negative neurological ROS  negative psych ROS   GI/Hepatic negative GI ROS, Neg liver ROS,,,  Endo/Other  negative endocrine ROS    Renal/GU negative Renal ROS  negative genitourinary   Musculoskeletal   Abdominal   Peds  Hematology negative hematology ROS (+)   Anesthesia Other Findings   Reproductive/Obstetrics negative OB ROS                             Anesthesia Physical Anesthesia Plan  ASA: 3  Anesthesia Plan: General   Post-op Pain Management: Minimal or no pain anticipated   Induction: Intravenous  PONV Risk Score and Plan: Propofol infusion  Airway Management Planned: Natural Airway and Nasal Cannula  Additional Equipment: None  Intra-op Plan:   Post-operative Plan:   Informed Consent: I have reviewed the patients History and Physical, chart, labs and discussed the procedure including the risks, benefits and alternatives for the proposed anesthesia with the patient or authorized representative who has indicated his/her understanding and acceptance.     Dental Advisory Given  Plan Discussed with: CRNA  Anesthesia Plan Comments:         Anesthesia Quick Evaluation

## 2022-12-08 ENCOUNTER — Other Ambulatory Visit: Payer: Self-pay

## 2022-12-08 ENCOUNTER — Encounter (HOSPITAL_COMMUNITY)
Admission: RE | Disposition: A | Discharge status: Home / Self Care | Payer: Self-pay | Source: Home / Self Care | Attending: Gastroenterology

## 2022-12-08 ENCOUNTER — Ambulatory Visit (HOSPITAL_COMMUNITY): Payer: Medicare Other | Admitting: Anesthesiology

## 2022-12-08 ENCOUNTER — Ambulatory Visit (HOSPITAL_BASED_OUTPATIENT_CLINIC_OR_DEPARTMENT_OTHER): Payer: Medicare Other | Admitting: Anesthesiology

## 2022-12-08 ENCOUNTER — Encounter (HOSPITAL_COMMUNITY): Payer: Self-pay

## 2022-12-08 ENCOUNTER — Encounter (INDEPENDENT_AMBULATORY_CARE_PROVIDER_SITE_OTHER): Payer: Self-pay | Admitting: *Deleted

## 2022-12-08 ENCOUNTER — Ambulatory Visit (HOSPITAL_COMMUNITY)
Admission: RE | Admit: 2022-12-08 | Discharge: 2022-12-08 | Disposition: A | Discharge status: Home / Self Care | Payer: Medicare Other | Attending: Gastroenterology | Admitting: Gastroenterology

## 2022-12-08 DIAGNOSIS — Z1211 Encounter for screening for malignant neoplasm of colon: Secondary | ICD-10-CM | POA: Diagnosis not present

## 2022-12-08 DIAGNOSIS — Z800 Family history of malignant neoplasm of digestive organs: Secondary | ICD-10-CM | POA: Diagnosis not present

## 2022-12-08 DIAGNOSIS — Z860101 Personal history of adenomatous and serrated colon polyps: Secondary | ICD-10-CM | POA: Insufficient documentation

## 2022-12-08 DIAGNOSIS — Z860100 Personal history of colon polyps, unspecified: Secondary | ICD-10-CM

## 2022-12-08 DIAGNOSIS — K644 Residual hemorrhoidal skin tags: Secondary | ICD-10-CM | POA: Diagnosis not present

## 2022-12-08 DIAGNOSIS — I2510 Atherosclerotic heart disease of native coronary artery without angina pectoris: Secondary | ICD-10-CM | POA: Diagnosis not present

## 2022-12-08 DIAGNOSIS — K648 Other hemorrhoids: Secondary | ICD-10-CM | POA: Diagnosis not present

## 2022-12-08 DIAGNOSIS — Z08 Encounter for follow-up examination after completed treatment for malignant neoplasm: Secondary | ICD-10-CM | POA: Diagnosis not present

## 2022-12-08 DIAGNOSIS — K5730 Diverticulosis of large intestine without perforation or abscess without bleeding: Secondary | ICD-10-CM | POA: Insufficient documentation

## 2022-12-08 DIAGNOSIS — Z85038 Personal history of other malignant neoplasm of large intestine: Secondary | ICD-10-CM | POA: Insufficient documentation

## 2022-12-08 DIAGNOSIS — Z09 Encounter for follow-up examination after completed treatment for conditions other than malignant neoplasm: Secondary | ICD-10-CM | POA: Insufficient documentation

## 2022-12-08 HISTORY — PX: COLONOSCOPY WITH PROPOFOL: SHX5780

## 2022-12-08 LAB — HM COLONOSCOPY

## 2022-12-08 SURGERY — COLONOSCOPY WITH PROPOFOL
Anesthesia: General

## 2022-12-08 MED ORDER — LACTATED RINGERS IV SOLN: INTRAVENOUS | Status: DC | PRN

## 2022-12-08 MED ORDER — PROPOFOL 500 MG/50ML IV EMUL
INTRAVENOUS | Status: DC | PRN
  Administered 2022-12-08: 150 ug/kg/min via INTRAVENOUS

## 2022-12-08 MED ORDER — EPHEDRINE SULFATE-NACL 50-0.9 MG/10ML-% IV SOSY
PREFILLED_SYRINGE | INTRAVENOUS | Status: DC | PRN
Start: 2022-12-08 — End: 2022-12-08
  Administered 2022-12-08 (×2): 5 mg via INTRAVENOUS
  Administered 2022-12-08: 10 mg via INTRAVENOUS

## 2022-12-08 MED ORDER — EPHEDRINE 5 MG/ML INJ
INTRAVENOUS | Status: AC
Start: 2022-12-08 — End: ?
  Filled 2022-12-08: qty 5

## 2022-12-08 MED ORDER — LIDOCAINE HCL (CARDIAC) PF 100 MG/5ML IV SOSY
PREFILLED_SYRINGE | INTRAVENOUS | Status: DC | PRN
  Administered 2022-12-08: 50 mg via INTRAVENOUS

## 2022-12-08 MED ORDER — PROPOFOL 10 MG/ML IV BOLUS
INTRAVENOUS | Status: DC | PRN
  Administered 2022-12-08: 30 mg via INTRAVENOUS
  Administered 2022-12-08: 100 mg via INTRAVENOUS

## 2022-12-08 NOTE — Op Note (Addendum)
[facility] Patient Name: Todd Carlson Procedure Date: 12/08/2022 6:59 AM MRN: [id number] Date of Birth: 12/19/1945 Attending MD: Sanjuan Dame , MD, [id number] CSN: [id number] Age: 77 Admit Type: Outpatient Procedure:                Colonoscopy Indications:              Screening in patient at increased risk: Family                            history of 1st-degree relative with colorectal                            cancer before age 73 years, High risk colon cancer                            surveillance: Personal history of colonic polyps Providers:                Sanjuan Dame, MD, Angelica Ran, Dyann Ruddle Referring MD:              Medicines:                Monitored Anesthesia Care Complications:            No immediate complications. Estimated Blood Loss:     Estimated blood loss: none. Procedure:                Pre-Anesthesia Assessment:                           - Prior to the procedure, a History and Physical                            was performed, and patient medications and                            allergies were reviewed. The patient's tolerance of                            previous anesthesia was also reviewed. The risks                            and benefits of the procedure and the sedation                            options and risks were discussed with the patient.                            All questions were answered, and informed consent                            was obtained. Prior Anticoagulants: The patient has                            taken no anticoagulant or antiplatelet agents  except for aspirin. ASA Grade Assessment: II - A                            patient with mild systemic disease. After reviewing                            the risks and benefits, the patient was deemed in                            satisfactory condition to undergo the procedure.                           After obtaining informed consent,  the colonoscope                            was passed under direct vision. Throughout the                            procedure, the patient's blood pressure, pulse, and                            oxygen saturations were monitored continuously. The                            [id number]) scope was introduced through the                            anus and advanced to the the cecum, identified by                            appendiceal orifice and ileocecal valve. The                            colonoscopy was performed without difficulty. The                            patient tolerated the procedure well. The quality                            of the bowel preparation was evaluated using the                            BBPS [facility] Bowel Preparation Scale) with scores                            of: Right Colon = 3, Transverse Colon = 3 and Left                            Colon = 3 (entire mucosa seen well with no residual                            staining, small fragments of stool or opaque  liquid). The total BBPS score equals 9. The                            ileocecal valve, appendiceal orifice, and rectum                            were photographed. Scope In: 7:43:24 AM Scope Out: 8:02:16 AM Scope Withdrawal Time: 0 hours 16 minutes 32 seconds  Total Procedure Duration: 0 hours 18 minutes 52 seconds  Findings:      The perianal and digital rectal examinations were normal.      Scattered diverticula were found in the left colon.      Non-bleeding external and internal hemorrhoids were found during       retroflexion. The hemorrhoids were small. Impression:               - Diverticulosis in the left colon.                           - Non-bleeding external and internal hemorrhoids.                           - No specimens collected. Moderate Sedation:      Per Anesthesia Care Recommendation:           - Patient has a contact number available for                             emergencies. The signs and symptoms of potential                            delayed complications were discussed with the                            patient. Return to normal activities tomorrow.                            Written discharge instructions were provided to the                            patient.                           - Resume previous diet.                           - Continue present medications.                           - Return to primary care physician as previously                            scheduled.                           - Repeat colonoscopy in 5 years for surveillance  brother with CRC and patient himself had multiple                            polyps previoulsy Procedure Code(s):        --- Professional ---                           G0105, Colorectal cancer screening; colonoscopy on                            individual at high risk Diagnosis Code(s):        --- Professional ---                           Z80.0, Family history of malignant neoplasm of                            digestive organs                           Z86.010, Personal history of colonic polyps                           K64.8, Other hemorrhoids                           K57.30, Diverticulosis of large intestine without                            perforation or abscess without bleeding CPT copyright 2022 [employer]. All rights reserved. The codes documented in this report are preliminary and upon coder review may  be revised to meet current compliance requirements. Sanjuan Dame, MD Sanjuan Dame, MD 12/08/2022 8:08:16 AM This report has been signed electronically. Number of Addenda: 0

## 2022-12-08 NOTE — H&P (Addendum)
Primary Care Physician:  Estanislado Pandy, MD Primary Gastroenterologist:  Dr. Tasia Catchings  Pre-Procedure History & Physical: HPI:  Todd Carlson is a 77 y.o. male is here for a colonoscopy for colon cancer screening purposes.  Patient reports that recently his brother was diagnosed with colon cancer    No melena or hematochezia.  No abdominal pain or unintentional weight loss.  No change in bowel habits.  Overall feels well from a GI standpoint.  2019 colonoscopy .  2 polyps removed and there tubular adenomas.  He had multiple adenomas on prior colonoscopy by Dr. Teena Dunk.   Past Medical History:  Diagnosis Date   Coronary artery disease     Past Surgical History:  Procedure Laterality Date   COLONOSCOPY N/A 10/10/2017   Procedure: COLONOSCOPY;  Surgeon: Malissa Hippo, MD;  Location: [facility];  Service: Endoscopy;  Laterality: N/A;  930   CORONARY ARTERY BYPASS GRAFT     POLYPECTOMY  10/10/2017   Procedure: POLYPECTOMY;  Surgeon: Malissa Hippo, MD;  Location: [facility];  Service: Endoscopy;;   TONSILLECTOMY      Prior to Admission medications   Medication Sig Start Date End Date Taking? Authorizing Provider  Acetylcarnitine HCl (ACETYL L-CARNITINE) 500 MG CAPS Take 1,000 mg by mouth 2 (two) times daily.    [provider]  Ascorbyl Palmitate POWD Take 1,000 mg by mouth 2 (two) times daily.    [provider]  aspirin EC 81 MG tablet Take 1 tablet (81 mg total) by mouth daily. 10/17/17   Rehman, Joline Maxcy, MD  atorvastatin (LIPITOR) 20 MG tablet Take 20 mg by mouth at bedtime.    [provider]  Barberry-Oreg Grape-Goldenseal (BERBERINE COMPLEX PO) Take 800-1,200 mg by mouth See admin instructions. Take 1200 mg by mouth in the morning and take 800 mg by mouth at bedtime    [provider]  Cholecalciferol (VITAMIN D3) 10000 units TABS Take 10,000 Units by mouth 2 (two) times daily.    [provider]  ciprofloxacin (CIPRO) 500 MG  tablet Take 1 tablet (500 mg total) by mouth 2 (two) times daily. One po bid x 7 days 05/04/19   Bethann Berkshire, MD  Coenzyme Q10 (CO Q-10) 300 MG CAPS Take 300 mg by mouth 2 (two) times daily.    [provider]  Garlic 1200 MG CAPS Take 1,200 mg by mouth 2 (two) times daily.    [provider]  HYDROcodone-acetaminophen (NORCO/VICODIN) 5-325 MG tablet Take 1 tablet by mouth every 6 (six) hours as needed for moderate pain. 05/04/19   Bethann Berkshire, MD  Magnesium ([employer] TRIPLE MAGNESIUM COMPLEX) 400 MG CAPS Take 2,000 mg by mouth daily.    [provider]  MALIC ACID PO Take 600 mg by mouth 2 (two) times daily.    [provider]  metoprolol succinate (TOPROL-XL) 25 MG 24 hr tablet Take 12.5 mg by mouth daily. 02/24/19   [provider]  metroNIDAZOLE (FLAGYL) 500 MG tablet Take 1 tablet (500 mg total) by mouth 4 (four) times daily. 05/04/19   Bethann Berkshire, MD  Misc Natural Products (BETA-SITOSTEROL PLANT STEROLS PO) Take 400 mg by mouth 2 (two) times daily.    [provider]  Multiple Vitamins-Minerals (MULTIVITAMIN PO) Take 1 tablet by mouth daily.    [provider]  Omega-3 Fatty Acids (FISH OIL PO) Take 5,400 mg by mouth 2 (two) times daily.    [provider]  ondansetron (ZOFRAN ODT)  4 MG disintegrating tablet 4mg  ODT q4 hours prn nausea/vomit 05/04/19   Bethann Berkshire, MD  PANTETHINE PO Take 600 mg by mouth 2 (two) times daily.    [provider]  PHOSPHATIDYL CHOLINE PO Take 1,260 mg by mouth 2 (two) times daily.    [provider]  PRALUENT 75 MG/ML SOAJ Inject 1 pen into the skin every 14 (fourteen) days. 04/21/19   [provider]  Probiotic Product (PROBIOTIC PO) Take 1 capsule by mouth at bedtime.    [provider]  Taurine 500 MG CAPS Take 500 mg by mouth 2 (two) times daily.    [provider]  thiamine (VITAMIN B-1) 100 MG tablet Take 100 mg by mouth daily.    [provider]  Turmeric 500 MG CAPS Take 1,000 mg by mouth 2 (two) times daily.    [provider]  VITAMIN K PO Take 2,000 mcg by mouth daily.    [provider]    Allergies as of 11/02/2022 - Review Complete 05/04/2019  Allergen Reaction Noted   Penicillins Diarrhea, Nausea Only, and Other (See Comments) 11/03/2013    History reviewed. No pertinent family history.  Social History   Socioeconomic History   Marital status: Married    Spouse name: Not on file   Number of children: Not on file   Years of education: Not on file   Highest education level: Not on file  Occupational History   Not on file  Tobacco Use   Smoking status: Never   Smokeless tobacco: Never  Vaping Use   Vaping status: Never Used  Substance and Sexual Activity   Alcohol use: Not Currently   Drug use: Not Currently   Sexual activity: Not on file  Other Topics Concern   Not on file  Social History Narrative   Not on file   Social Determinants of Health   Financial Resource Strain: Not on file  Food Insecurity: Not on file  Transportation Needs: Not on file  Physical Activity: Not on file  Stress: Not on file  Social Connections: Not on file  Intimate Partner Violence: Not on file    Review of Systems: See HPI, otherwise negative ROS  Physical Exam: Vital signs in last 24 hours: Temp:  [98.4 F (36.9 C)] 98.4 F (36.9 C) (10/25 0654) Pulse Rate:  [71] 71 (10/25 0654) Resp:  [19] 19 (10/25 0654) BP: (154)/(58) 154/58 (10/25 0654) SpO2:  [97 %] 97 % (10/25 0654) Weight:  [77.1 kg] 77.1 kg (10/25 0654)   General:   Alert,  Well-developed, well-nourished, pleasant and cooperative in NAD Head:  Normocephalic and atraumatic. Eyes:  Sclera clear, no icterus.   Conjunctiva pink. Ears:  Normal auditory acuity. Nose:  No deformity, discharge,  or lesions. Msk:  Symmetrical without gross deformities. Normal posture. Extremities:  Without clubbing or edema. Neurologic:   Alert and  oriented x4;  grossly normal neurologically. Skin:  Intact without significant lesions or rashes. Psych:  Alert and cooperative. Normal mood and affect.  Impression/Plan: Todd Carlson is here for a colonoscopy to be performed for colon cancer screening purposes.  The risks of the procedure including infection, bleed, or perforation as well as benefits, limitations, alternatives and imponderables have been reviewed with the patient. Questions have been answered. All parties agreeable.

## 2022-12-08 NOTE — Transfer of Care (Signed)
Immediate Anesthesia Transfer of Care Note  Patient: Todd Carlson  Procedure(s) Performed: COLONOSCOPY WITH PROPOFOL  Patient Location: Endoscopy Unit  Anesthesia Type:General  Level of Consciousness: awake  Airway & Oxygen Therapy: Patient Spontanous Breathing  Post-op Assessment: Report given to RN and Post -op Vital signs reviewed and stable  Post vital signs: Reviewed and stable  Last Vitals:  Vitals Value Taken Time  BP 113/51 12/08/22 0805  Temp 36.5 C 12/08/22 0805  Pulse 81 12/08/22 0805  Resp 14 12/08/22 0805  SpO2 96 % 12/08/22 0805    Last Pain:  Vitals:   12/08/22 0805  TempSrc: Oral  PainSc: 0-No pain      Patients Stated Pain Goal: 6 (12/08/22 0654)  Complications: No notable events documented.

## 2022-12-08 NOTE — Anesthesia Postprocedure Evaluation (Signed)
Anesthesia Post Note  Patient: Todd Carlson  Procedure(s) Performed: COLONOSCOPY WITH PROPOFOL  Patient location during evaluation: PACU Anesthesia Type: General Level of consciousness: awake and alert Pain management: pain level controlled Vital Signs Assessment: post-procedure vital signs reviewed and stable Respiratory status: spontaneous breathing, nonlabored ventilation, respiratory function stable and patient connected to nasal cannula oxygen Cardiovascular status: blood pressure returned to baseline and stable Postop Assessment: no apparent nausea or vomiting Anesthetic complications: no   There were no known notable events for this encounter.   Last Vitals:  Vitals:   12/08/22 0654 12/08/22 0805  BP: (!) 154/58 (!) 113/51  Pulse: 71 81  Resp: 19 14  Temp: 36.9 C 36.5 C  SpO2: 97% 96%    Last Pain:  Vitals:   12/08/22 0805  TempSrc: Oral  PainSc: 0-No pain                 Deldrick Linch L Callista Hoh

## 2022-12-08 NOTE — Discharge Instructions (Signed)

## 2022-12-08 NOTE — Anesthesia Procedure Notes (Addendum)
Date/Time: 12/08/2022 7:42 AM  Performed by: Julian Reil, CRNAPre-anesthesia Checklist: Patient identified, Emergency Drugs available, Suction available and Patient being monitored Patient Re-evaluated:Patient Re-evaluated prior to induction Oxygen Delivery Method: Nasal cannula Induction Type: IV induction Placement Confirmation: positive ETCO2

## 2022-12-12 ENCOUNTER — Telehealth (INDEPENDENT_AMBULATORY_CARE_PROVIDER_SITE_OTHER): Payer: Self-pay | Admitting: *Deleted

## 2022-12-12 NOTE — Telephone Encounter (Signed)
Spoke to the patient over the phone   Reports mild lower abdomen pain which is positional without any fever chills . Tolerating regular diet and having bowel movement. On phone call patient was sitting and denied any pain but only comes when he moves around hence this is positional pain which likely musculoskeletal in nature .   Patient Colonoscopy was reviewed without any biopsies or polypectomy hence patient was reassured . Again patient was given ED precautions with any severe pain , fever chills, swelling of abdomen , nausea or vomiting .  Patient verbalizes understanding

## 2022-12-12 NOTE — Telephone Encounter (Signed)
Patient called me this AM and stated he has had some abd pain--no bleeding or swelling since Saturday.  Told him I would pass this information to Dr. Tasia Catchings and the nurses

## 2022-12-14 ENCOUNTER — Encounter (HOSPITAL_COMMUNITY): Payer: Self-pay | Admitting: Gastroenterology

## 2023-01-19 DIAGNOSIS — I10 Essential (primary) hypertension: Secondary | ICD-10-CM | POA: Diagnosis not present

## 2023-01-19 DIAGNOSIS — R7303 Prediabetes: Secondary | ICD-10-CM | POA: Diagnosis not present

## 2023-01-19 DIAGNOSIS — R7989 Other specified abnormal findings of blood chemistry: Secondary | ICD-10-CM | POA: Diagnosis not present

## 2023-01-19 DIAGNOSIS — R739 Hyperglycemia, unspecified: Secondary | ICD-10-CM | POA: Diagnosis not present

## 2023-01-19 DIAGNOSIS — E7849 Other hyperlipidemia: Secondary | ICD-10-CM | POA: Diagnosis not present

## 2023-01-25 DIAGNOSIS — E7849 Other hyperlipidemia: Secondary | ICD-10-CM | POA: Diagnosis not present

## 2023-01-25 DIAGNOSIS — E785 Hyperlipidemia, unspecified: Secondary | ICD-10-CM | POA: Diagnosis not present

## 2023-01-25 DIAGNOSIS — E782 Mixed hyperlipidemia: Secondary | ICD-10-CM | POA: Diagnosis not present

## 2023-01-25 DIAGNOSIS — R7301 Impaired fasting glucose: Secondary | ICD-10-CM | POA: Diagnosis not present

## 2023-01-25 DIAGNOSIS — M545 Low back pain, unspecified: Secondary | ICD-10-CM | POA: Diagnosis not present

## 2023-01-25 DIAGNOSIS — R030 Elevated blood-pressure reading, without diagnosis of hypertension: Secondary | ICD-10-CM | POA: Diagnosis not present

## 2023-01-25 DIAGNOSIS — T466X5A Adverse effect of antihyperlipidemic and antiarteriosclerotic drugs, initial encounter: Secondary | ICD-10-CM | POA: Diagnosis not present

## 2023-01-25 DIAGNOSIS — R739 Hyperglycemia, unspecified: Secondary | ICD-10-CM | POA: Diagnosis not present

## 2023-01-25 DIAGNOSIS — G720 Drug-induced myopathy: Secondary | ICD-10-CM | POA: Diagnosis not present

## 2023-01-25 DIAGNOSIS — I25810 Atherosclerosis of coronary artery bypass graft(s) without angina pectoris: Secondary | ICD-10-CM | POA: Diagnosis not present

## 2023-01-25 DIAGNOSIS — R7989 Other specified abnormal findings of blood chemistry: Secondary | ICD-10-CM | POA: Diagnosis not present

## 2023-01-25 DIAGNOSIS — E559 Vitamin D deficiency, unspecified: Secondary | ICD-10-CM | POA: Diagnosis not present

## 2023-01-25 DIAGNOSIS — Z8639 Personal history of other endocrine, nutritional and metabolic disease: Secondary | ICD-10-CM | POA: Diagnosis not present

## 2023-01-25 DIAGNOSIS — I2510 Atherosclerotic heart disease of native coronary artery without angina pectoris: Secondary | ICD-10-CM | POA: Diagnosis not present

## 2023-02-26 DIAGNOSIS — H2513 Age-related nuclear cataract, bilateral: Secondary | ICD-10-CM | POA: Diagnosis not present

## 2023-04-11 DIAGNOSIS — H6123 Impacted cerumen, bilateral: Secondary | ICD-10-CM | POA: Diagnosis not present

## 2023-05-24 DIAGNOSIS — E782 Mixed hyperlipidemia: Secondary | ICD-10-CM | POA: Diagnosis not present

## 2023-05-24 DIAGNOSIS — E559 Vitamin D deficiency, unspecified: Secondary | ICD-10-CM | POA: Diagnosis not present

## 2023-05-24 DIAGNOSIS — Z1321 Encounter for screening for nutritional disorder: Secondary | ICD-10-CM | POA: Diagnosis not present

## 2023-05-24 DIAGNOSIS — E1159 Type 2 diabetes mellitus with other circulatory complications: Secondary | ICD-10-CM | POA: Diagnosis not present

## 2023-05-24 DIAGNOSIS — I10 Essential (primary) hypertension: Secondary | ICD-10-CM | POA: Diagnosis not present

## 2023-05-24 DIAGNOSIS — R7301 Impaired fasting glucose: Secondary | ICD-10-CM | POA: Diagnosis not present

## 2023-05-24 DIAGNOSIS — R739 Hyperglycemia, unspecified: Secondary | ICD-10-CM | POA: Diagnosis not present

## 2023-05-24 DIAGNOSIS — E785 Hyperlipidemia, unspecified: Secondary | ICD-10-CM | POA: Diagnosis not present

## 2023-05-31 DIAGNOSIS — E559 Vitamin D deficiency, unspecified: Secondary | ICD-10-CM | POA: Diagnosis not present

## 2023-05-31 DIAGNOSIS — Z0000 Encounter for general adult medical examination without abnormal findings: Secondary | ICD-10-CM | POA: Diagnosis not present

## 2023-05-31 DIAGNOSIS — Z0001 Encounter for general adult medical examination with abnormal findings: Secondary | ICD-10-CM | POA: Diagnosis not present

## 2023-05-31 DIAGNOSIS — E1369 Other specified diabetes mellitus with other specified complication: Secondary | ICD-10-CM | POA: Diagnosis not present

## 2023-05-31 DIAGNOSIS — E1159 Type 2 diabetes mellitus with other circulatory complications: Secondary | ICD-10-CM | POA: Diagnosis not present

## 2023-05-31 DIAGNOSIS — E785 Hyperlipidemia, unspecified: Secondary | ICD-10-CM | POA: Diagnosis not present

## 2023-05-31 DIAGNOSIS — Z1389 Encounter for screening for other disorder: Secondary | ICD-10-CM | POA: Diagnosis not present

## 2023-06-12 DIAGNOSIS — E785 Hyperlipidemia, unspecified: Secondary | ICD-10-CM | POA: Diagnosis not present

## 2023-06-12 DIAGNOSIS — I2510 Atherosclerotic heart disease of native coronary artery without angina pectoris: Secondary | ICD-10-CM | POA: Diagnosis not present

## 2023-09-28 DIAGNOSIS — E785 Hyperlipidemia, unspecified: Secondary | ICD-10-CM | POA: Diagnosis not present

## 2023-09-28 DIAGNOSIS — D559 Anemia due to enzyme disorder, unspecified: Secondary | ICD-10-CM | POA: Diagnosis not present

## 2023-09-28 DIAGNOSIS — R739 Hyperglycemia, unspecified: Secondary | ICD-10-CM | POA: Diagnosis not present

## 2023-09-28 DIAGNOSIS — R7301 Impaired fasting glucose: Secondary | ICD-10-CM | POA: Diagnosis not present

## 2023-09-28 DIAGNOSIS — E559 Vitamin D deficiency, unspecified: Secondary | ICD-10-CM | POA: Diagnosis not present

## 2023-10-03 DIAGNOSIS — R7989 Other specified abnormal findings of blood chemistry: Secondary | ICD-10-CM | POA: Diagnosis not present

## 2023-10-03 DIAGNOSIS — E1369 Other specified diabetes mellitus with other specified complication: Secondary | ICD-10-CM | POA: Diagnosis not present

## 2023-10-03 DIAGNOSIS — E785 Hyperlipidemia, unspecified: Secondary | ICD-10-CM | POA: Diagnosis not present

## 2023-10-03 DIAGNOSIS — I10 Essential (primary) hypertension: Secondary | ICD-10-CM | POA: Diagnosis not present

## 2023-11-08 DIAGNOSIS — H6123 Impacted cerumen, bilateral: Secondary | ICD-10-CM | POA: Diagnosis not present
